# Patient Record
Sex: Female | Born: 1955 | Race: White | Hispanic: No | Marital: Married | Smoking: Never smoker
Health system: Southern US, Community
[De-identification: ages and names within clinical notes are randomized; demographics above are authoritative.]

## PROBLEM LIST (undated history)

## (undated) DIAGNOSIS — E785 Hyperlipidemia, unspecified: Secondary | ICD-10-CM

## (undated) DIAGNOSIS — T8859XA Other complications of anesthesia, initial encounter: Secondary | ICD-10-CM

## (undated) DIAGNOSIS — E079 Disorder of thyroid, unspecified: Secondary | ICD-10-CM

## (undated) DIAGNOSIS — K222 Esophageal obstruction: Secondary | ICD-10-CM

## (undated) DIAGNOSIS — T4145XA Adverse effect of unspecified anesthetic, initial encounter: Secondary | ICD-10-CM

## (undated) DIAGNOSIS — G4700 Insomnia, unspecified: Secondary | ICD-10-CM

## (undated) DIAGNOSIS — IMO0001 Reserved for inherently not codable concepts without codable children: Secondary | ICD-10-CM

## (undated) DIAGNOSIS — M810 Age-related osteoporosis without current pathological fracture: Secondary | ICD-10-CM

## (undated) DIAGNOSIS — Z973 Presence of spectacles and contact lenses: Secondary | ICD-10-CM

## (undated) HISTORY — DX: Disorder of thyroid, unspecified: E07.9

## (undated) HISTORY — DX: Reserved for inherently not codable concepts without codable children: IMO0001

## (undated) HISTORY — PX: COLONOSCOPY: SHX174

---
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
## 1983-12-24 DIAGNOSIS — E0500 Thyrotoxicosis with diffuse goiter without thyrotoxic crisis or storm: Secondary | ICD-10-CM

## 1983-12-24 HISTORY — DX: Thyrotoxicosis with diffuse goiter without thyrotoxic crisis or storm: E05.00

## 2000-07-29 ENCOUNTER — Encounter: Payer: Self-pay | Admitting: Obstetrics and Gynecology

## 2000-07-29 ENCOUNTER — Encounter: Admission: RE | Admit: 2000-07-29 | Discharge: 2000-07-29 | Payer: Self-pay | Admitting: Obstetrics and Gynecology

## 2000-07-31 ENCOUNTER — Encounter: Admission: RE | Admit: 2000-07-31 | Discharge: 2000-07-31 | Payer: Self-pay | Admitting: Obstetrics and Gynecology

## 2000-07-31 ENCOUNTER — Encounter: Payer: Self-pay | Admitting: Obstetrics and Gynecology

## 2001-08-04 ENCOUNTER — Encounter: Admission: RE | Admit: 2001-08-04 | Discharge: 2001-08-04 | Payer: Self-pay | Admitting: Obstetrics and Gynecology

## 2001-08-04 ENCOUNTER — Encounter: Payer: Self-pay | Admitting: Obstetrics and Gynecology

## 2002-08-06 ENCOUNTER — Encounter: Payer: Self-pay | Admitting: Obstetrics and Gynecology

## 2002-08-06 ENCOUNTER — Encounter: Admission: RE | Admit: 2002-08-06 | Discharge: 2002-08-06 | Payer: Self-pay | Admitting: Obstetrics and Gynecology

## 2003-08-09 ENCOUNTER — Encounter: Admission: RE | Admit: 2003-08-09 | Discharge: 2003-08-09 | Payer: Self-pay | Admitting: Obstetrics and Gynecology

## 2003-08-09 ENCOUNTER — Encounter: Payer: Self-pay | Admitting: Obstetrics and Gynecology

## 2004-08-14 ENCOUNTER — Encounter: Admission: RE | Admit: 2004-08-14 | Discharge: 2004-08-14 | Payer: Self-pay | Admitting: Obstetrics and Gynecology

## 2005-08-15 ENCOUNTER — Encounter: Admission: RE | Admit: 2005-08-15 | Discharge: 2005-08-15 | Payer: Self-pay | Admitting: Obstetrics and Gynecology

## 2005-08-16 ENCOUNTER — Encounter: Admission: RE | Admit: 2005-08-16 | Discharge: 2005-08-16 | Payer: Self-pay | Admitting: Obstetrics and Gynecology

## 2006-01-28 ENCOUNTER — Encounter: Admission: RE | Admit: 2006-01-28 | Discharge: 2006-01-28 | Payer: Self-pay | Admitting: Obstetrics and Gynecology

## 2006-08-21 ENCOUNTER — Encounter: Admission: RE | Admit: 2006-08-21 | Discharge: 2006-08-21 | Payer: Self-pay | Admitting: Obstetrics and Gynecology

## 2007-09-01 ENCOUNTER — Encounter: Admission: RE | Admit: 2007-09-01 | Discharge: 2007-09-01 | Payer: Self-pay | Admitting: Obstetrics and Gynecology

## 2008-09-01 ENCOUNTER — Encounter: Admission: RE | Admit: 2008-09-01 | Discharge: 2008-09-01 | Payer: Self-pay | Admitting: Obstetrics and Gynecology

## 2008-09-06 ENCOUNTER — Encounter: Admission: RE | Admit: 2008-09-06 | Discharge: 2008-09-06 | Payer: Self-pay | Admitting: Obstetrics and Gynecology

## 2009-09-08 ENCOUNTER — Encounter: Admission: RE | Admit: 2009-09-08 | Discharge: 2009-09-08 | Payer: Self-pay | Admitting: Obstetrics and Gynecology

## 2010-09-11 ENCOUNTER — Encounter: Admission: RE | Admit: 2010-09-11 | Discharge: 2010-09-11 | Payer: Self-pay | Admitting: Internal Medicine

## 2011-04-19 ENCOUNTER — Ambulatory Visit (AMBULATORY_SURGERY_CENTER): Payer: BC Managed Care – PPO | Admitting: *Deleted

## 2011-04-19 ENCOUNTER — Encounter: Payer: Self-pay | Admitting: Internal Medicine

## 2011-04-19 VITALS — Ht 62.0 in | Wt <= 1120 oz

## 2011-04-19 DIAGNOSIS — Z1211 Encounter for screening for malignant neoplasm of colon: Secondary | ICD-10-CM

## 2011-04-19 MED ORDER — PEG-KCL-NACL-NASULF-NA ASC-C 100 G PO SOLR: ORAL | Status: DC

## 2011-05-03 ENCOUNTER — Ambulatory Visit (AMBULATORY_SURGERY_CENTER): Payer: BC Managed Care – PPO | Admitting: Internal Medicine

## 2011-05-03 ENCOUNTER — Encounter: Payer: Self-pay | Admitting: Internal Medicine

## 2011-05-03 VITALS — BP 139/87 | HR 91 | Temp 98.2°F | Resp 14 | Ht 62.0 in | Wt 107.0 lb

## 2011-05-03 DIAGNOSIS — Z1211 Encounter for screening for malignant neoplasm of colon: Secondary | ICD-10-CM

## 2011-05-03 MED ORDER — SODIUM CHLORIDE 0.9 % IV SOLN: 500.0000 mL | INTRAVENOUS | Status: DC

## 2011-05-06 ENCOUNTER — Telehealth: Payer: Self-pay | Admitting: *Deleted

## 2011-05-06 NOTE — Telephone Encounter (Signed)

## 2011-08-09 ENCOUNTER — Other Ambulatory Visit: Payer: Self-pay | Admitting: Internal Medicine

## 2011-08-09 DIAGNOSIS — Z1231 Encounter for screening mammogram for malignant neoplasm of breast: Secondary | ICD-10-CM

## 2011-09-13 ENCOUNTER — Ambulatory Visit
Admission: RE | Admit: 2011-09-13 | Discharge: 2011-09-13 | Disposition: A | Discharge status: Home / Self Care | Payer: BC Managed Care – PPO | Source: Ambulatory Visit | Attending: Internal Medicine | Admitting: Internal Medicine

## 2011-09-13 DIAGNOSIS — Z1231 Encounter for screening mammogram for malignant neoplasm of breast: Secondary | ICD-10-CM

## 2011-09-18 ENCOUNTER — Encounter (HOSPITAL_COMMUNITY): Payer: Self-pay

## 2011-09-18 ENCOUNTER — Emergency Department (HOSPITAL_COMMUNITY): Payer: BC Managed Care – PPO

## 2011-09-18 ENCOUNTER — Emergency Department (HOSPITAL_COMMUNITY)
Admission: EM | Admit: 2011-09-18 | Discharge: 2011-09-18 | Disposition: A | Discharge status: Home / Self Care | Payer: BC Managed Care – PPO | Attending: Emergency Medicine | Admitting: Emergency Medicine

## 2011-09-18 DIAGNOSIS — R51 Headache: Secondary | ICD-10-CM | POA: Insufficient documentation

## 2011-09-18 DIAGNOSIS — R209 Unspecified disturbances of skin sensation: Secondary | ICD-10-CM | POA: Insufficient documentation

## 2011-09-18 DIAGNOSIS — M62838 Other muscle spasm: Secondary | ICD-10-CM | POA: Insufficient documentation

## 2011-09-18 DIAGNOSIS — R079 Chest pain, unspecified: Secondary | ICD-10-CM | POA: Insufficient documentation

## 2011-09-18 LAB — URINALYSIS, ROUTINE W REFLEX MICROSCOPIC
Glucose, UA: NEGATIVE mg/dL
Leukocytes, UA: NEGATIVE
pH: 7 (ref 5.0–8.0)

## 2011-09-18 LAB — CBC
HCT: 37.4 % (ref 36.0–46.0)
Hemoglobin: 12.8 g/dL (ref 12.0–15.0)
MCHC: 34.2 g/dL (ref 30.0–36.0)
MCV: 94.4 fL (ref 78.0–100.0)

## 2011-09-18 LAB — POCT I-STAT, CHEM 8
Creatinine, Ser: 0.5 mg/dL (ref 0.50–1.10)
Glucose, Bld: 106 mg/dL — ABNORMAL HIGH (ref 70–99)
Hemoglobin: 13.6 g/dL (ref 12.0–15.0)
Potassium: 4.1 mEq/L (ref 3.5–5.1)

## 2011-09-18 LAB — DIFFERENTIAL
Basophils Absolute: 0.1 10*3/uL (ref 0.0–0.1)
Basophils Relative: 1 % (ref 0–1)
Eosinophils Absolute: 0 10*3/uL (ref 0.0–0.7)
Lymphocytes Relative: 21 % (ref 12–46)
Lymphs Abs: 1.3 10*3/uL (ref 0.7–4.0)
Monocytes Absolute: 0.9 10*3/uL (ref 0.1–1.0)

## 2011-10-21 ENCOUNTER — Encounter: Payer: Self-pay | Admitting: Internal Medicine

## 2011-10-21 ENCOUNTER — Ambulatory Visit (INDEPENDENT_AMBULATORY_CARE_PROVIDER_SITE_OTHER): Payer: BC Managed Care – PPO | Admitting: Internal Medicine

## 2011-10-21 VITALS — BP 142/90 | HR 96 | Temp 98.3°F | Ht 61.5 in | Wt 113.0 lb

## 2011-10-21 DIAGNOSIS — Z23 Encounter for immunization: Secondary | ICD-10-CM

## 2011-10-21 DIAGNOSIS — E079 Disorder of thyroid, unspecified: Secondary | ICD-10-CM

## 2011-10-21 NOTE — Progress Notes (Signed)
  Subjective:    Patient ID: Crystal [name], female    DOB: Nov 30, 1956, 55 y.o.   MRN: [id number]  HPI  To establish  Past Medical History  Diagnosis Date  . Thyroid disease 1985    grave's dzs---treated with PTU   History reviewed. No pertinent past surgical history.  reports that she has never smoked. She does not have any smokeless tobacco history on file. She reports that she drinks about 8.4 ounces of alcohol per week. She reports that she does not use illicit drugs. family history includes Dementia in her mother; Heart attack in her father; Heart disease in her father; and Heart disease (age of onset:80) in her mother. Allergies  Allergen Reactions  . E-Mycin (Erythromycin Base) Swelling  . Other Swelling    Adhesive tape, bandaids    Review of Systems  patient denies chest pain, shortness of breath, orthopnea. Denies lower extremity edema, abdominal pain, change in appetite, change in bowel movements. Patient denies rashes, musculoskeletal complaints. No other specific complaints in a complete review of systems.      Objective:   Physical Exam  Well-developed well-nourished female in no acute distress. HEENT exam atraumatic, normocephalic, extraocular muscles are intact. Neck is supple. No jugular venous distention no thyromegaly. Chest clear to auscultation without increased work of breathing. Cardiac exam S1 and S2 are regular. Abdominal exam active bowel sounds, soft, nontender. Extremities no edema. Neurologic exam she is alert without any motor sensory deficits. Gait is normal.        Assessment & Plan:

## 2011-10-21 NOTE — Patient Instructions (Signed)
Medical release for previous records  GYN---[facility] (Tim Fontaine, [place])

## 2011-10-30 NOTE — Assessment & Plan Note (Signed)
History of Graves' disease. Needs periodic followup. She is currently asymptomatic. Graves' disease was many years ago.

## 2012-04-13 ENCOUNTER — Other Ambulatory Visit (INDEPENDENT_AMBULATORY_CARE_PROVIDER_SITE_OTHER): Payer: BC Managed Care – PPO

## 2012-04-13 DIAGNOSIS — Z0000 Encounter for general adult medical examination without abnormal findings: Secondary | ICD-10-CM

## 2012-04-13 LAB — POCT URINALYSIS DIPSTICK
Protein, UA: NEGATIVE
Spec Grav, UA: 1.01
Urobilinogen, UA: 0.2
pH, UA: 6.5

## 2012-04-13 LAB — LIPID PANEL
Cholesterol: 230 mg/dL — ABNORMAL HIGH (ref 0–200)
Total CHOL/HDL Ratio: 3

## 2012-04-13 LAB — HEPATIC FUNCTION PANEL
Albumin: 4.3 g/dL (ref 3.5–5.2)
Alkaline Phosphatase: 45 U/L (ref 39–117)
Total Protein: 7.3 g/dL (ref 6.0–8.3)

## 2012-04-13 LAB — CBC WITH DIFFERENTIAL/PLATELET
Basophils Relative: 0.7 % (ref 0.0–3.0)
Eosinophils Absolute: 0.4 10*3/uL (ref 0.0–0.7)
Hemoglobin: 13.2 g/dL (ref 12.0–15.0)
MCHC: 33.2 g/dL (ref 30.0–36.0)
MCV: 97.2 fl (ref 78.0–100.0)
Monocytes Absolute: 0.8 10*3/uL (ref 0.1–1.0)
Neutro Abs: 2.1 10*3/uL (ref 1.4–7.7)
RBC: 4.1 Mil/uL (ref 3.87–5.11)

## 2012-04-13 LAB — BASIC METABOLIC PANEL
CO2: 27 mEq/L (ref 19–32)
Chloride: 103 mEq/L (ref 96–112)
Sodium: 140 mEq/L (ref 135–145)

## 2012-04-20 ENCOUNTER — Encounter: Payer: Self-pay | Admitting: Internal Medicine

## 2012-04-20 ENCOUNTER — Ambulatory Visit (INDEPENDENT_AMBULATORY_CARE_PROVIDER_SITE_OTHER): Payer: BC Managed Care – PPO | Admitting: Internal Medicine

## 2012-04-20 ENCOUNTER — Encounter: Payer: BC Managed Care – PPO | Admitting: Internal Medicine

## 2012-04-20 VITALS — BP 150/90 | HR 98 | Temp 98.2°F | Ht 62.0 in | Wt 110.0 lb

## 2012-04-20 DIAGNOSIS — Z0000 Encounter for general adult medical examination without abnormal findings: Secondary | ICD-10-CM

## 2012-04-20 DIAGNOSIS — Z23 Encounter for immunization: Secondary | ICD-10-CM

## 2012-04-20 MED ORDER — AMITRIPTYLINE HCL 25 MG PO TABS: 25.0000 mg | ORAL_TABLET | Freq: Every day | ORAL | Status: DC

## 2012-04-20 NOTE — Progress Notes (Signed)
Patient ID: Crystal [name], female   DOB: [birth year], 56 y.o.   MRN: [id number]  cpx  Past Medical History  Diagnosis Date  . Thyroid disease 1985    grave's dzs---treated with PTU    History   Social History  . Marital Status: Married    Spouse Name: N/A    Number of Children: N/A  . Years of Education: N/A   Occupational History  . Not on file.   Social History Main Topics  . Smoking status: Never Smoker   . Smokeless tobacco: Not on file  . Alcohol Use: 8.4 oz/week    14 Glasses of wine per week  . Drug Use: No  . Sexually Active: Not on file   Other Topics Concern  . Not on file   Social History Narrative  . No narrative on file    No past surgical history on file.  Family History  Problem Relation Age of Onset  . Dementia Mother   . Heart disease Mother 24    sudden cardiac---?? heart block  . Heart disease Father     CABG  . Heart attack Father     Allergies  Allergen Reactions  . E-Mycin (Erythromycin Base) Swelling  . Other Swelling    Adhesive tape, bandaids    Current Outpatient Prescriptions on File Prior to Visit  Medication Sig Dispense Refill  . amitriptyline (ELAVIL) 25 MG tablet Take 25 mg by mouth at bedtime.        . Calcium-Vitamin D-Vitamin K (CALCIUM SOFT CHEWS PO) Take 2 each by mouth daily.           patient denies chest pain, shortness of breath, orthopnea. Denies lower extremity edema, abdominal pain, change in appetite, change in bowel movements. Patient denies rashes, musculoskeletal complaints. No other specific complaints in a complete review of systems.   BP 150/90  Pulse 98  Temp(Src) 98.2 F (36.8 C) (Oral)  Ht 5\' 2"  (1.575 m)  Wt 110 lb (49.896 kg)  BMI 20.12 kg/m2  Well-developed well-nourished female in no acute distress. HEENT exam atraumatic, normocephalic, extraocular muscles are intact. Neck is supple. No jugular venous distention no thyromegaly. Chest clear to auscultation without increased work of  breathing. Cardiac exam S1 and S2 are regular. Abdominal exam active bowel sounds, soft, nontender. Extremities no edema. Neurologic exam she is alert without any motor sensory deficits. Gait is normal. exernal genitalia normal, cervix normal, PAP smear done   Well Visit: health maint UTD

## 2012-05-14 ENCOUNTER — Other Ambulatory Visit: Payer: Self-pay | Admitting: Internal Medicine

## 2012-08-05 ENCOUNTER — Other Ambulatory Visit: Payer: Self-pay | Admitting: Internal Medicine

## 2012-08-05 DIAGNOSIS — Z1231 Encounter for screening mammogram for malignant neoplasm of breast: Secondary | ICD-10-CM

## 2012-09-15 ENCOUNTER — Ambulatory Visit
Admission: RE | Admit: 2012-09-15 | Discharge: 2012-09-15 | Disposition: A | Discharge status: Home / Self Care | Payer: BC Managed Care – PPO | Source: Ambulatory Visit | Attending: Internal Medicine | Admitting: Internal Medicine

## 2012-09-15 DIAGNOSIS — Z1231 Encounter for screening mammogram for malignant neoplasm of breast: Secondary | ICD-10-CM

## 2012-10-26 ENCOUNTER — Ambulatory Visit (INDEPENDENT_AMBULATORY_CARE_PROVIDER_SITE_OTHER): Payer: BC Managed Care – PPO | Admitting: Internal Medicine

## 2012-10-26 DIAGNOSIS — Z23 Encounter for immunization: Secondary | ICD-10-CM

## 2013-01-26 ENCOUNTER — Other Ambulatory Visit: Payer: Self-pay | Admitting: Internal Medicine

## 2013-01-30 ENCOUNTER — Other Ambulatory Visit: Payer: Self-pay | Admitting: Internal Medicine

## 2013-04-20 ENCOUNTER — Other Ambulatory Visit (INDEPENDENT_AMBULATORY_CARE_PROVIDER_SITE_OTHER): Payer: BC Managed Care – PPO

## 2013-04-20 DIAGNOSIS — Z0000 Encounter for general adult medical examination without abnormal findings: Secondary | ICD-10-CM

## 2013-04-20 LAB — CBC WITH DIFFERENTIAL/PLATELET
Basophils Relative: 0 % (ref 0.0–3.0)
Eosinophils Relative: 6.8 % — ABNORMAL HIGH (ref 0.0–5.0)
Hemoglobin: 13.6 g/dL (ref 12.0–15.0)
Lymphocytes Relative: 35.3 % (ref 12.0–46.0)
MCHC: 35 g/dL (ref 30.0–36.0)
Monocytes Relative: 11.6 % (ref 3.0–12.0)
Neutro Abs: 2.3 10*3/uL (ref 1.4–7.7)
RBC: 4.05 Mil/uL (ref 3.87–5.11)
WBC: 5 10*3/uL (ref 4.5–10.5)

## 2013-04-20 LAB — HEPATIC FUNCTION PANEL
ALT: 21 U/L (ref 0–35)
Albumin: 4.2 g/dL (ref 3.5–5.2)
Total Bilirubin: 0.7 mg/dL (ref 0.3–1.2)

## 2013-04-20 LAB — POCT URINALYSIS DIPSTICK
Glucose, UA: NEGATIVE
Nitrite, UA: NEGATIVE
Urobilinogen, UA: 0.2

## 2013-04-20 LAB — LIPID PANEL
LDL Cholesterol: 106 mg/dL — ABNORMAL HIGH (ref 0–99)
Total CHOL/HDL Ratio: 3
VLDL: 12.6 mg/dL (ref 0.0–40.0)

## 2013-04-20 LAB — BASIC METABOLIC PANEL
BUN: 10 mg/dL (ref 6–23)
CO2: 30 mEq/L (ref 19–32)
Chloride: 103 mEq/L (ref 96–112)
Creatinine, Ser: 0.8 mg/dL (ref 0.4–1.2)
Glucose, Bld: 96 mg/dL (ref 70–99)

## 2013-04-20 LAB — TSH: TSH: 2.47 u[IU]/mL (ref 0.35–5.50)

## 2013-05-06 ENCOUNTER — Other Ambulatory Visit: Payer: BC Managed Care – PPO

## 2013-05-10 ENCOUNTER — Encounter: Payer: Self-pay | Admitting: Internal Medicine

## 2013-05-11 ENCOUNTER — Encounter: Payer: Self-pay | Admitting: Internal Medicine

## 2013-05-11 ENCOUNTER — Ambulatory Visit (INDEPENDENT_AMBULATORY_CARE_PROVIDER_SITE_OTHER): Payer: BC Managed Care – PPO | Admitting: Internal Medicine

## 2013-05-11 VITALS — BP 150/90 | HR 96 | Temp 97.9°F | Ht 61.5 in | Wt 102.0 lb

## 2013-05-11 DIAGNOSIS — Z0000 Encounter for general adult medical examination without abnormal findings: Secondary | ICD-10-CM

## 2013-05-11 NOTE — Progress Notes (Signed)
cpx  Past Medical History  Diagnosis Date  . Thyroid disease 1985    grave's dzs---treated with PTU    History   Social History  . Marital Status: Married    Spouse Name: N/A    Number of Children: N/A  . Years of Education: N/A   Occupational History  . Not on file.   Social History Main Topics  . Smoking status: Never Smoker   . Smokeless tobacco: Not on file  . Alcohol Use: 8.4 oz/week    14 Glasses of wine per week  . Drug Use: No  . Sexually Active: Not on file   Other Topics Concern  . Not on file   Social History Narrative  . No narrative on file    No past surgical history on file.  Family History  Problem Relation Age of Onset  . Dementia Mother   . Heart disease Mother 94    sudden cardiac---?? heart block  . Heart disease Father     CABG  . Heart attack Father     Allergies  Allergen Reactions  . E-Mycin (Erythromycin Base) Swelling  . Other Swelling    Adhesive tape, bandaids    Current Outpatient Prescriptions on File Prior to Visit  Medication Sig Dispense Refill  . amitriptyline (ELAVIL) 25 MG tablet TAKE 1 OR 2 TABLETS BY MOUTH AT BEDTIME  180 tablet  0  . Calcium-Vitamin D-Vitamin K (CALCIUM SOFT CHEWS PO) Take 2 each by mouth daily.         No current facility-administered medications on file prior to visit.     patient denies chest pain, shortness of breath, orthopnea. Denies lower extremity edema, abdominal pain, change in appetite, change in bowel movements. Patient denies rashes, musculoskeletal complaints. No other specific complaints in a complete review of systems.   There were no vitals taken for this visit.  Well-developed well-nourished female in no acute distress. HEENT exam atraumatic, normocephalic, extraocular muscles are intact. Neck is supple. No jugular venous distention no thyromegaly. Chest clear to auscultation without increased work of breathing. Cardiac exam S1 and S2 are regular. Abdominal exam active bowel  sounds, soft, nontender. Extremities no edema. Neurologic exam she is alert without any motor sensory deficits. Gait is normal.  A/P- well visit- health maint utd

## 2013-08-19 ENCOUNTER — Other Ambulatory Visit: Payer: Self-pay | Admitting: Internal Medicine

## 2013-08-19 ENCOUNTER — Other Ambulatory Visit: Payer: Self-pay

## 2013-08-19 DIAGNOSIS — Z0000 Encounter for general adult medical examination without abnormal findings: Secondary | ICD-10-CM

## 2013-08-19 DIAGNOSIS — Z1231 Encounter for screening mammogram for malignant neoplasm of breast: Secondary | ICD-10-CM

## 2013-09-02 ENCOUNTER — Other Ambulatory Visit: Payer: Self-pay | Admitting: Dermatology

## 2013-09-16 ENCOUNTER — Ambulatory Visit: Payer: BC Managed Care – PPO

## 2013-09-18 ENCOUNTER — Other Ambulatory Visit: Payer: Self-pay | Admitting: Internal Medicine

## 2013-09-22 ENCOUNTER — Ambulatory Visit
Admission: RE | Admit: 2013-09-22 | Discharge: 2013-09-22 | Disposition: A | Discharge status: Home / Self Care | Payer: BC Managed Care – PPO | Source: Ambulatory Visit

## 2013-09-22 ENCOUNTER — Ambulatory Visit (HOSPITAL_BASED_OUTPATIENT_CLINIC_OR_DEPARTMENT_OTHER): Payer: BC Managed Care – PPO

## 2013-09-22 DIAGNOSIS — Z1231 Encounter for screening mammogram for malignant neoplasm of breast: Secondary | ICD-10-CM

## 2013-09-29 ENCOUNTER — Ambulatory Visit (INDEPENDENT_AMBULATORY_CARE_PROVIDER_SITE_OTHER): Payer: BC Managed Care – PPO

## 2013-09-29 DIAGNOSIS — Z23 Encounter for immunization: Secondary | ICD-10-CM

## 2014-07-04 ENCOUNTER — Other Ambulatory Visit: Payer: BC Managed Care – PPO

## 2014-07-04 ENCOUNTER — Encounter: Payer: BC Managed Care – PPO | Admitting: Internal Medicine

## 2014-07-05 ENCOUNTER — Other Ambulatory Visit (INDEPENDENT_AMBULATORY_CARE_PROVIDER_SITE_OTHER): Payer: 59

## 2014-07-05 DIAGNOSIS — Z0000 Encounter for general adult medical examination without abnormal findings: Secondary | ICD-10-CM

## 2014-07-05 LAB — POCT URINALYSIS DIPSTICK
BILIRUBIN UA: NEGATIVE
Blood, UA: NEGATIVE
GLUCOSE UA: NEGATIVE
KETONES UA: NEGATIVE
Leukocytes, UA: NEGATIVE
Nitrite, UA: NEGATIVE
SPEC GRAV UA: 1.02
Urobilinogen, UA: 0.2
pH, UA: 6.5

## 2014-07-05 LAB — HEPATIC FUNCTION PANEL
ALK PHOS: 46 U/L (ref 39–117)
ALT: 20 U/L (ref 0–35)
AST: 34 U/L (ref 0–37)
Albumin: 4.2 g/dL (ref 3.5–5.2)
BILIRUBIN TOTAL: 0.7 mg/dL (ref 0.2–1.2)
Bilirubin, Direct: 0.1 mg/dL (ref 0.0–0.3)
Total Protein: 7.1 g/dL (ref 6.0–8.3)

## 2014-07-05 LAB — CBC WITH DIFFERENTIAL/PLATELET
Basophils Absolute: 0.1 10*3/uL (ref 0.0–0.1)
Basophils Relative: 1.2 % (ref 0.0–3.0)
EOS ABS: 0.2 10*3/uL (ref 0.0–0.7)
EOS PCT: 4 % (ref 0.0–5.0)
HCT: 41.1 % (ref 36.0–46.0)
Hemoglobin: 13.4 g/dL (ref 12.0–15.0)
Lymphocytes Relative: 44 % (ref 12.0–46.0)
Lymphs Abs: 2.3 10*3/uL (ref 0.7–4.0)
MCHC: 32.7 g/dL (ref 30.0–36.0)
MCV: 99.3 fl (ref 78.0–100.0)
MONO ABS: 0.7 10*3/uL (ref 0.1–1.0)
Monocytes Relative: 12.7 % — ABNORMAL HIGH (ref 3.0–12.0)
NEUTROS PCT: 38.1 % — AB (ref 43.0–77.0)
Neutro Abs: 2 10*3/uL (ref 1.4–7.7)
PLATELETS: 261 10*3/uL (ref 150.0–400.0)
RBC: 4.14 Mil/uL (ref 3.87–5.11)
RDW: 14.1 % (ref 11.5–15.5)
WBC: 5.3 10*3/uL (ref 4.0–10.5)

## 2014-07-05 LAB — LIPID PANEL
CHOLESTEROL: 226 mg/dL — AB (ref 0–200)
HDL: 85.2 mg/dL (ref >39.00)
LDL Cholesterol: 130 mg/dL — ABNORMAL HIGH (ref 0–99)
NonHDL: 140.8
TRIGLYCERIDES: 55 mg/dL (ref 0.0–149.0)
Total CHOL/HDL Ratio: 3
VLDL: 11 mg/dL (ref 0.0–40.0)

## 2014-07-05 LAB — TSH: TSH: 4.26 u[IU]/mL (ref 0.35–4.50)

## 2014-07-05 LAB — BASIC METABOLIC PANEL
BUN: 11 mg/dL (ref 6–23)
CHLORIDE: 102 meq/L (ref 96–112)
CO2: 30 mEq/L (ref 19–32)
CREATININE: 0.6 mg/dL (ref 0.4–1.2)
Calcium: 9.1 mg/dL (ref 8.4–10.5)
GFR: 101.26 mL/min (ref >60.00)
Glucose, Bld: 96 mg/dL (ref 70–99)
Potassium: 3.7 mEq/L (ref 3.5–5.1)
Sodium: 139 mEq/L (ref 135–145)

## 2014-07-11 ENCOUNTER — Encounter: Payer: BC Managed Care – PPO | Admitting: Internal Medicine

## 2014-07-12 ENCOUNTER — Encounter: Payer: Self-pay | Admitting: Internal Medicine

## 2014-07-12 ENCOUNTER — Ambulatory Visit (INDEPENDENT_AMBULATORY_CARE_PROVIDER_SITE_OTHER): Payer: 59 | Admitting: Internal Medicine

## 2014-07-12 VITALS — BP 140/89 | HR 88 | Temp 98.5°F | Ht 62.0 in | Wt 104.0 lb

## 2014-07-12 DIAGNOSIS — Z780 Asymptomatic menopausal state: Secondary | ICD-10-CM

## 2014-07-12 NOTE — Progress Notes (Signed)
Pre visit review using our clinic review tool, if applicable. No additional management support is needed unless otherwise documented below in the visit note. 

## 2014-07-12 NOTE — Patient Instructions (Addendum)
Schedule DEXA scan

## 2014-07-12 NOTE — Progress Notes (Signed)
cpx  Past Medical History  Diagnosis Date  . Thyroid disease 1985    grave's dzs---treated with PTU    History   Social History  . Marital Status: Married    Spouse Name: N/A    Number of Children: N/A  . Years of Education: N/A   Occupational History  . Not on file.   Social History Main Topics  . Smoking status: Never Smoker   . Smokeless tobacco: Not on file  . Alcohol Use: 8.4 oz/week    14 Glasses of wine per week  . Drug Use: No  . Sexual Activity: Not on file   Other Topics Concern  . Not on file   Social History Narrative  . No narrative on file    No past surgical history on file.  Family History  Problem Relation Age of Onset  . Dementia Mother   . Heart disease Mother 88    sudden cardiac---?? heart block  . Heart disease Father     CABG  . Heart attack Father     Allergies  Allergen Reactions  . E-Mycin [Erythromycin Base] Swelling  . Other Swelling    Adhesive tape, bandaids    Current Outpatient Prescriptions on File Prior to Visit  Medication Sig Dispense Refill  . amitriptyline (ELAVIL) 25 MG tablet TAKE 1 TO 2 TABLETS BY MOUTH AT BEDTIME  180 tablet  2  . Calcium-Vitamin D-Vitamin K (CALCIUM SOFT CHEWS PO) Take 2 each by mouth daily.         No current facility-administered medications on file prior to visit.     patient denies chest pain, shortness of breath, orthopnea. Denies lower extremity edema, abdominal pain, change in appetite, change in bowel movements. Patient denies rashes, musculoskeletal complaints. No other specific complaints in a complete review of systems.   BP 140/89  Pulse 88  Temp(Src) 98.5 F (36.9 C) (Oral)  Ht 5\' 2"  (1.575 m)  Wt 104 lb (47.174 kg)  BMI 19.02 kg/m2  Well-developed well-nourished female in no acute distress. HEENT exam atraumatic, normocephalic, extraocular muscles are intact. Neck is supple. No jugular venous distention no thyromegaly. Chest clear to auscultation without increased work of  breathing. Cardiac exam S1 and S2 are regular. Abdominal exam active bowel sounds, soft, nontender. Extremities no edema. Neurologic exam she is alert without any motor sensory deficits. Gait is normal.  Well visit- health maint UTD

## 2014-07-13 ENCOUNTER — Ambulatory Visit (INDEPENDENT_AMBULATORY_CARE_PROVIDER_SITE_OTHER)
Admission: RE | Admit: 2014-07-13 | Discharge: 2014-07-13 | Disposition: A | Discharge status: Home / Self Care | Payer: 59 | Source: Ambulatory Visit | Attending: Internal Medicine | Admitting: Internal Medicine

## 2014-07-13 DIAGNOSIS — Z780 Asymptomatic menopausal state: Secondary | ICD-10-CM

## 2014-07-21 ENCOUNTER — Telehealth: Payer: Self-pay | Admitting: Internal Medicine

## 2014-07-21 NOTE — Telephone Encounter (Signed)
Pt would like bone density test results

## 2014-07-22 NOTE — Telephone Encounter (Signed)
Pt following up on bone density results.

## 2014-07-25 ENCOUNTER — Encounter: Payer: Self-pay | Admitting: Internal Medicine

## 2014-07-26 ENCOUNTER — Encounter: Payer: Self-pay | Admitting: Family Medicine

## 2014-07-26 ENCOUNTER — Ambulatory Visit (INDEPENDENT_AMBULATORY_CARE_PROVIDER_SITE_OTHER): Payer: 59 | Admitting: Family Medicine

## 2014-07-26 VITALS — BP 160/94 | HR 88 | Temp 97.8°F | Wt 102.0 lb

## 2014-07-26 DIAGNOSIS — M810 Age-related osteoporosis without current pathological fracture: Secondary | ICD-10-CM

## 2014-07-26 DIAGNOSIS — IMO0001 Reserved for inherently not codable concepts without codable children: Secondary | ICD-10-CM

## 2014-07-26 DIAGNOSIS — R030 Elevated blood-pressure reading, without diagnosis of hypertension: Secondary | ICD-10-CM | POA: Insufficient documentation

## 2014-07-26 NOTE — Assessment & Plan Note (Addendum)
1. Advised 1200mg  calcium and 800 IU vitamin D. Discussed risks of these medications as well specifically calcium.  2. Recommended minimum 30 minutes 3x a week exercise (patient also asked for potential PT or trainer to supervise program-told her I would ask Dr. Charlann Boxer of [place] for any recommendations).  3. Advised to reduce wine intake to 1 glass per day.  4. Medication-given only unilateral score of -2.6 and otherwise with osteopenia, we discussed medication therapy. Patient would prefer to stay off medication if possible. We ultimately agreed to above measures as well as repeat bone density in 1 year, if progression would start on alendronate next year.   Addendum 07/28/14. Will refer to Dr. Charlann Boxer of [facility] for discussion of exercise plan.

## 2014-07-26 NOTE — Assessment & Plan Note (Signed)
May be element of white coat HTN. Asked patient to bring in home cuff so we can compare. If cuff's do not correlate, will need to start on agent and do some basic labs for HTN.

## 2014-07-26 NOTE — Patient Instructions (Addendum)
Osteoporosis  Exercise at least 30 minutes 3x a week  I will contact Dr. Tamala Julian to see if he has any recommendations for specific trainers or PT for osteoporosis  I would recommend 1200mg  calcium and 800 IU vitamin D. Foods rich in calcium such as dairy would be a decent choice as well.   We will recheck your bone mineral density in 1 year.   Blood pressure goal <140/90  When we see each other back bring your cuff so we can verify  Dash diet (helps with blood pressure) or mediterranean diet (cardiovascular disease)  Follow up in 6 months

## 2014-07-26 NOTE — Progress Notes (Signed)
  Garret Reddish, MD Phone: [phone]  Subjective:   Crystal [name] is a 58 y.o. year old very pleasant female patient who presents with the following:  Osteoporosis Recently discovered on bone density with unilateral femur score of -2.6. No history of fractures in adult life. History hyperthyroidism. Nonsmoker. Does drink 2 glasses of wine per day.  ROS- no weakness of fatigue.   Hypertension BP Readings from Last 3 Encounters:  07/26/14 160/94  07/12/14 140/89  05/11/13 150/90  home BP monitoring-usually 130s over 80s at home when checks Compliant with medications-no medications.  ROS-Denies any CP, HA, SOB, blurry vision, LE edema  Past Medical History- Patient Active Problem List   Diagnosis Date Noted  . Elevated blood pressure 07/26/2014  . Osteoporosis 07/26/2014  . Thyroid disease    Medications- reviewed and updated Current Outpatient Prescriptions  Medication Sig Dispense Refill  . amitriptyline (ELAVIL) 25 MG tablet TAKE 1 TO 2 TABLETS BY MOUTH AT BEDTIME  180 tablet  2  . Calcium-Vitamin D-Vitamin K (CALCIUM SOFT CHEWS PO) Take 2 each by mouth daily.         No current facility-administered medications for this visit.   Objective: BP 160/94  Pulse 88  Temp(Src) 97.8 F (36.6 C)  Wt 102 lb (46.267 kg) Gen: NAD, resting comfortably in chair CV: RRR no murmurs rubs or gallops Lungs: CTAB no crackles, wheeze, rhonchi Ext: no edema   Assessment/Plan:  Elevated blood pressure May be element of white coat HTN. Asked patient to bring in home cuff so we can compare. If cuff's do not correlate, will need to start on agent and do some basic labs for HTN.   Osteoporosis 1. Advised 1200mg  calcium and 800 IU vitamin D. Discussed risks of these medications as well specifically calcium.  2. Recommended minimum 30 minutes 3x a week exercise (patient also asked for potential PT or trainer to supervise program-told her I would ask Dr. Charlann Boxer of [place] for any  recommendations).  3. Advised to reduce wine intake to 1 glass per day.  4. Medication-given only unilateral score of -2.6 and otherwise with osteopenia, we discussed medication therapy. Patient would prefer to stay off medication if possible. We ultimately agreed to above measures as well as repeat bone density in 1 year, if progression would start on alendronate next year.    >50% of 30 minute office visit was spent on counseling about osteoporosis, lifestyle changes, and decision making on therapy (helping patient through options) and coordination of care   F/u 6 months to discuss blood pressure, obtain labs

## 2014-07-28 ENCOUNTER — Encounter: Payer: Self-pay | Admitting: Family Medicine

## 2014-07-28 NOTE — Addendum Note (Signed)
Addended by: Marin Olp on: 07/28/2014 12:20 PM   Modules accepted: Orders

## 2014-08-02 ENCOUNTER — Encounter: Payer: Self-pay | Admitting: Family Medicine

## 2014-08-02 ENCOUNTER — Ambulatory Visit (INDEPENDENT_AMBULATORY_CARE_PROVIDER_SITE_OTHER): Payer: 59 | Admitting: Family Medicine

## 2014-08-02 VITALS — BP 158/96 | HR 86 | Ht 62.0 in | Wt 104.0 lb

## 2014-08-02 DIAGNOSIS — M810 Age-related osteoporosis without current pathological fracture: Secondary | ICD-10-CM

## 2014-08-02 NOTE — Patient Instructions (Signed)
Very nice to meet you Vitamin D 2000 IU daily, and calcium 1000mg  daily.  Glucosamine 1500mg  daily.  Exercise-  4 days a week.  Each day we will focus on new body part-  1 set of 15, 1 set of 12 and 1 set of 8 for each exercise.   Focus on the lengthening of the exercise.  1.  Arms and shoulder 2.  Chest 3. Back.  4. Legs.  Cardio after lifting. Would try rowing or elliptical or biking etc.  Goal time is 20-30 minutes. With eating When waking up 2 glasses of water immediatly and need to eat something.  After working out 4:1 ratio of carbs to protein within 30 minutes of working out (chocolate milk) or whey protein isolate with some frui tin milk.  Never go longer then 3 hours without eating something. Nuts, yogurt, cottage chesse etc.  Increasing protein when you can canhhelp  Before exercise try to have some simple carbs (fruit, cheese, gatorade) Come back in 4 weeks.

## 2014-08-02 NOTE — Assessment & Plan Note (Signed)
We discussed a significant amount of time about different changes patient can make that will be beneficial. We went into great detail about timing as well as exercises specifically to be helpful. We discussed proper technique of multiple different exercises that I think will be helpful. We discussed dividing different body groups in 2 days as well as decreasing the cardiovascular and increasing resistance training. We also discussed over-the-counter medications improper supplementation that can be helpful. We discussed diet modifications in timing of meals that could also be beneficial. Patient is going to make these adjustments over the course of the next 4 weeks and then come back and see me again further evaluation and treatment.

## 2014-08-02 NOTE — Progress Notes (Signed)
  [facility] [place], [place] [address] Phone: [phone] Subjective:    I'm seeing this patient by the request  of:  Garret Reddish, MD   CC:  Osteoporosis  [id number] Crystal [name] is a 58 y.o. female coming in with complaint of osteoporosis. Patient was found to have this newly diagnosed on bone scan.  Patient does have a T score less than negative 2 and multiple areas. Patient has started doing some calcium supplementation. Patient states that she has always been in fairly good health other than thyroid disease and hypertension. Patient is very active stating that she goes to the gym 3 times a week usually at night. Patient attempts to watch her diet and has been on a diet modification for her husband to decrease the cardiovascular risk she states. This causes him to have a decreased amount of carbohydrates as well as a decreased amount of protein. 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 Patient states that she does have daily aches and pains in multiple joints but nothing that stoppers from any activity. Patient has tried some over-the-counter medications with no significant benefit. Patient does not have a strong family history of osteoporosis but some pebbly history of cardiovascular risk is why she has always had focused on cardiovascular exercise. Patient had not been doing a lot of exercises except for her arms.  Patient also when she has time likes to play golf. Patient is looking for some direction to help her decrease her risk of progression of the osteoporosis and such things as compression fractures or worsening daily pain.    Past medical history, social, surgical and family history all reviewed in electronic medical record.   Review of Systems: No headache, visual changes, nausea, vomiting, diarrhea, constipation, dizziness, abdominal pain, skin rash, fevers, chills, night sweats, weight loss, swollen lymph nodes,  body aches, joint swelling, muscle aches, chest pain, shortness of breath, mood changes.   Objective Blood pressure 158/96, pulse 86, height 5\' 2"  (1.575 m), weight 104 lb (47.174 kg).  General: No apparent distress alert and oriented x3 mood and affect normal, dressed appropriately.  HEENT: Pupils equal, extraocular movements intact  Respiratory: Patient's speak in full sentences and does not appear short of breath  Cardiovascular: No lower extremity edema, non tender, no erythema  Skin: Warm dry intact with no signs of infection or rash on extremities or on axial skeleton.  Abdomen: Soft nontender  Neuro: Cranial nerves II through XII are intact, neurovascularly intact in all extremities with 2+ DTRs and 2+ pulses.  Lymph: No lymphadenopathy of posterior or anterior cervical chain or axillae bilaterally.  Gait normal with good balance and coordination.  MSK:  Non tender with full range of motion and good stability and symmetric strength and tone of shoulders, elbows, wrist, hip, knee and ankles bilaterally. . Minimal osteoarthritic changes of the hand. Body mass index is 19.02 kg/(m^2).    Impression and Recommendations:

## 2014-08-12 ENCOUNTER — Other Ambulatory Visit: Payer: Self-pay

## 2014-08-12 DIAGNOSIS — Z1231 Encounter for screening mammogram for malignant neoplasm of breast: Secondary | ICD-10-CM

## 2014-09-06 ENCOUNTER — Ambulatory Visit (INDEPENDENT_AMBULATORY_CARE_PROVIDER_SITE_OTHER): Payer: 59 | Admitting: Family Medicine

## 2014-09-06 ENCOUNTER — Encounter: Payer: Self-pay | Admitting: Family Medicine

## 2014-09-06 ENCOUNTER — Ambulatory Visit: Payer: 59 | Admitting: Family Medicine

## 2014-09-06 VITALS — BP 132/86 | HR 91 | Ht 62.0 in | Wt 104.0 lb

## 2014-09-06 DIAGNOSIS — Z23 Encounter for immunization: Secondary | ICD-10-CM

## 2014-09-06 DIAGNOSIS — M810 Age-related osteoporosis without current pathological fracture: Secondary | ICD-10-CM

## 2014-09-06 NOTE — Patient Instructions (Signed)
Good to see you Flu shot today.  Continue what you are doing.  Vitamin D is great Look for calcium citrate.  Continue the resistance training. 3 times a week and increase weight slowly.  When increasing weight decrease reps.  Then slowly increase reps back to 15/12/10 and then increase weight Continue the cardio as well consider sterching such as yoga 1 times a week.  Monitor the calcium reaction and if continues we can try a pill to decrease acid in your stomach, I don't think it will be necessary.  Come back and see me again in 6-8 weeks.

## 2014-09-06 NOTE — Assessment & Plan Note (Signed)
Discussed with patient at this time. She is doing very well with the calcium and vitamin D supplementation. Encourage her to continue to watch her diet closely. We discussed the benefits as well as the foods to potentially avoid. Patient did have good questions about alcohol and how that this could contribute to patient's blood pressure control. We also discussed certain medications and we could try patient had difficulty which patient declined. We discussed continuing to be active in how to increase the amount of resistance safely. Patient was given a new exercise prescription. Patient will follow up with me again in 6 weeks for further evaluation and treatment.  Spent greater than 25 minutes with patient face-to-face and had greater than 50% of counseling including as described above in assessment and plan.

## 2014-09-06 NOTE — Progress Notes (Signed)
  [facility] [place], [place] [address] Phone: [phone] Subjective:      CC:  Osteoporosis follow up  [id number] Crystal [name] is a 58 y.o. female coming in with complaint of osteoporosis. Patient was found to have this newly diagnosed on bone scan.  Patient does have a T score less than negative 2 and multiple areas. Patient has started doing some calcium supplementation. Patient states that she has always been in fairly good health other than thyroid disease and hypertension. Patient is very active stating that she goes to the gym 3 times a week usually at night. Patient also started doing more cardiovascular fitness to we discussed previously. Patient did make a changes and increase her protein supplementation especially after working out. We also gave patient recommendation of different resistance training. Patient states that overall she is improving. Patient states that her aches and pains have been cut by 50-60%. Patient does notice her balance and coordination and better. Patient is also playing the best golf she is placed in her life she states. Patient is very happy with the results so far. Patient denies any side effects any the over-the-counter medicines. Patient states that overall she is happy with the results.    Past medical history, social, surgical and family history all reviewed in electronic medical record.   Review of Systems: No headache, visual changes, nausea, vomiting, diarrhea, constipation, dizziness, abdominal pain, skin rash, fevers, chills, night sweats, weight loss, swollen lymph nodes, body aches, joint swelling, muscle aches, chest pain, shortness of breath, mood changes.   Objective Blood pressure 132/86, pulse 91, height 5\' 2"  (1.575 m), weight 104 lb (47.174 kg), SpO2 99.00%.  General: No apparent distress alert and oriented x3 mood and affect normal, dressed appropriately.  HEENT: Pupils equal, extraocular  movements intact  Respiratory: Patient's speak in full sentences and does not appear short of breath  Cardiovascular: No lower extremity edema, non tender, no erythema  Skin: Warm dry intact with no signs of infection or rash on extremities or on axial skeleton.  Abdomen: Soft nontender  Neuro: Cranial nerves II through XII are intact, neurovascularly intact in all extremities with 2+ DTRs and 2+ pulses.  Lymph: No lymphadenopathy of posterior or anterior cervical chain or axillae bilaterally.  Gait normal with good balance and coordination.  MSK:  Non tender with full range of motion and good stability and symmetric strength and tone of shoulders, elbows, wrist, hip, knee and ankles bilaterally. . Minimal osteoarthritic changes of the hand. Body mass index is 19.02 kg/(m^2). Change from previous exam   Impression and Recommendations:

## 2014-09-16 ENCOUNTER — Telehealth: Payer: Self-pay | Admitting: Family Medicine

## 2014-09-16 MED ORDER — AMITRIPTYLINE HCL 25 MG PO TABS: 25.0000 mg | ORAL_TABLET | Freq: Every day | ORAL | Status: DC

## 2014-09-16 NOTE — Telephone Encounter (Signed)
Medication refilled

## 2014-09-16 NOTE — Telephone Encounter (Signed)
[id number] [place] [name], [place] - [address] is requesting re-fill on amitriptyline (ELAVIL) 25 MG tablet

## 2014-09-23 ENCOUNTER — Ambulatory Visit
Admission: RE | Admit: 2014-09-23 | Discharge: 2014-09-23 | Disposition: A | Discharge status: Home / Self Care | Payer: 59 | Source: Ambulatory Visit

## 2014-09-23 DIAGNOSIS — Z1231 Encounter for screening mammogram for malignant neoplasm of breast: Secondary | ICD-10-CM

## 2014-09-29 ENCOUNTER — Other Ambulatory Visit: Payer: Self-pay

## 2014-09-29 ENCOUNTER — Telehealth: Payer: Self-pay

## 2014-09-29 MED ORDER — AMITRIPTYLINE HCL 25 MG PO TABS: 25.0000 mg | ORAL_TABLET | Freq: Every day | ORAL | Status: DC

## 2014-09-29 NOTE — Telephone Encounter (Signed)
Error

## 2014-10-05 ENCOUNTER — Other Ambulatory Visit: Payer: Self-pay

## 2014-10-05 DIAGNOSIS — Z1231 Encounter for screening mammogram for malignant neoplasm of breast: Secondary | ICD-10-CM

## 2014-10-18 ENCOUNTER — Encounter: Payer: Self-pay | Admitting: Family Medicine

## 2014-10-18 ENCOUNTER — Ambulatory Visit (INDEPENDENT_AMBULATORY_CARE_PROVIDER_SITE_OTHER): Payer: 59 | Admitting: Family Medicine

## 2014-10-18 VITALS — BP 120/82 | HR 97 | Ht 62.0 in | Wt 106.0 lb

## 2014-10-18 DIAGNOSIS — M810 Age-related osteoporosis without current pathological fracture: Secondary | ICD-10-CM

## 2014-10-18 NOTE — Patient Instructions (Signed)
Good to see you You are doing amazing overall.  Continue the meds and continue the weight training, you look great.  Greek yogurt with fairly low sugar content.  Balance-  Really have carbs, protein and fats with every meal.  Grilled chicken is great! Green leafy vegetables with at least dinner  Fish 1-2 times a week would be good (salmon, tilapia) Never wrong with nuts.  If having chocolate would have it with nuts in it,  Dark chocolate the better/.  Red wine most nights of the week.  See me when you need me.

## 2014-10-18 NOTE — Progress Notes (Signed)
  [facility] [address], [place] [address] Phone: [phone] Subjective:      CC:  Osteoporosis follow up  CXK:GYJEHUDJSH Crystal [name] is a 58 y.o. female coming in with complaint of osteoporosis. Patient was found to have this newly diagnosed on bone scan.  Patient does have a T score less than negative 2 and multiple areas. Patient has now been doing the over-the-counter supplementation including high-dose vitamin D. In addition this patient has been going to the gym on a very frequent basis and has been doing relatively well. Patient has noticed increase in strength and stamina. Patient states that also her dull aching which is very seldom has even decreased entirely. Patient states she has not had any side effects to the medications. Overall patient feels like she is doing remarkably well.    Past medical history, social, surgical and family history all reviewed in electronic medical record.   Review of Systems: No headache, visual changes, nausea, vomiting, diarrhea, constipation, dizziness, abdominal pain, skin rash, fevers, chills, night sweats, weight loss, swollen lymph nodes, body aches, joint swelling, muscle aches, chest pain, shortness of breath, mood changes.   Objective Blood pressure 120/82, pulse 97, height 5\' 2"  (1.575 m), weight 106 lb (48.081 kg), SpO2 99.00%.  General: No apparent distress alert and oriented x3 mood and affect normal, dressed appropriately.  HEENT: Pupils equal, extraocular movements intact  Respiratory: Patient's speak in full sentences and does not appear short of breath  Cardiovascular: No lower extremity edema, non tender, no erythema  Skin: Warm dry intact with no signs of infection or rash on extremities or on axial skeleton.  Abdomen: Soft nontender  Neuro: Cranial nerves II through XII are intact, neurovascularly intact in all extremities with 2+ DTRs and 2+ pulses.  Lymph: No lymphadenopathy of  posterior or anterior cervical chain or axillae bilaterally.  Gait normal with good balance and coordination.  MSK:  Non tender with full range of motion and good stability and symmetric strength and tone of shoulders, elbows, wrist, hip, knee and ankles bilaterally. . Minimal osteoarthritic changes of the hand. Body mass index is 19.38 kg/(m^2).    Impression and Recommendations:

## 2014-10-18 NOTE — Assessment & Plan Note (Signed)
Patient is doing remarkably well with conservative therapy at this time. We discussed the importance of continuing protein supplementation as well as weightbearing exercises. Discussed with patient no need to get another bone scan any earlier than 2 year interval and would even consider waiting for 5 year interval. Discussed with patient though with her BMI being normal and relatively on the low side potentially getting it elevated greater than 20 could be beneficial. Patient does have a fear of gaining weight which is likely one contribute intact her why patient has severe osteoarthritis at her age. Patient will continue to try to improve her diet and patient will follow-up again in 2-3 months for further evaluation.  Spent greater than 25 minutes with patient face-to-face and had greater than 50% of counseling including as described above in assessment and plan. Discussed in great detail patient's diet plan.

## 2015-01-18 ENCOUNTER — Ambulatory Visit: Payer: 59 | Admitting: Family Medicine

## 2015-05-24 ENCOUNTER — Other Ambulatory Visit: Payer: Self-pay

## 2015-05-30 ENCOUNTER — Other Ambulatory Visit: Payer: 59

## 2015-05-30 ENCOUNTER — Other Ambulatory Visit (INDEPENDENT_AMBULATORY_CARE_PROVIDER_SITE_OTHER): Payer: 59

## 2015-05-30 DIAGNOSIS — Z0000 Encounter for general adult medical examination without abnormal findings: Secondary | ICD-10-CM

## 2015-05-30 LAB — COMPREHENSIVE METABOLIC PANEL
ALK PHOS: 45 U/L (ref 39–117)
ALT: 18 U/L (ref 0–35)
AST: 23 U/L (ref 0–37)
Albumin: 4.2 g/dL (ref 3.5–5.2)
BILIRUBIN TOTAL: 0.5 mg/dL (ref 0.2–1.2)
BUN: 13 mg/dL (ref 6–23)
CALCIUM: 9 mg/dL (ref 8.4–10.5)
CO2: 29 mEq/L (ref 19–32)
Chloride: 100 mEq/L (ref 96–112)
Creatinine, Ser: 0.67 mg/dL (ref 0.40–1.20)
GFR: 95.74 mL/min (ref >60.00)
GLUCOSE: 98 mg/dL (ref 70–99)
Potassium: 3.7 mEq/L (ref 3.5–5.1)
Sodium: 135 mEq/L (ref 135–145)
Total Protein: 6.9 g/dL (ref 6.0–8.3)

## 2015-05-30 LAB — CBC WITH DIFFERENTIAL/PLATELET
Basophils Absolute: 0 10*3/uL (ref 0.0–0.1)
Basophils Relative: 0 % (ref 0.0–3.0)
EOS ABS: 0.3 10*3/uL (ref 0.0–0.7)
EOS PCT: 4.4 % (ref 0.0–5.0)
HCT: 40 % (ref 36.0–46.0)
Hemoglobin: 13.1 g/dL (ref 12.0–15.0)
Lymphocytes Relative: 35.2 % (ref 12.0–46.0)
Lymphs Abs: 2.2 10*3/uL (ref 0.7–4.0)
MCHC: 32.8 g/dL (ref 30.0–36.0)
MCV: 97.3 fl (ref 78.0–100.0)
MONO ABS: 0.9 10*3/uL (ref 0.1–1.0)
MONOS PCT: 13.9 % — AB (ref 3.0–12.0)
Neutro Abs: 2.9 10*3/uL (ref 1.4–7.7)
Neutrophils Relative %: 46.5 % (ref 43.0–77.0)
Platelets: 214 10*3/uL (ref 150.0–400.0)
RBC: 4.11 Mil/uL (ref 3.87–5.11)
RDW: 14.6 % (ref 11.5–15.5)
WBC: 6.2 10*3/uL (ref 4.0–10.5)

## 2015-05-30 LAB — POCT URINALYSIS DIPSTICK
Bilirubin, UA: NEGATIVE
Blood, UA: NEGATIVE
GLUCOSE UA: NEGATIVE
Ketones, UA: NEGATIVE
Leukocytes, UA: NEGATIVE
NITRITE UA: NEGATIVE
PH UA: 7
Protein, UA: NEGATIVE
SPEC GRAV UA: 1.01
Urobilinogen, UA: 0.2

## 2015-05-30 LAB — LIPID PANEL
CHOL/HDL RATIO: 2
CHOLESTEROL: 237 mg/dL — AB (ref 0–200)
HDL: 102.4 mg/dL (ref >39.00)
LDL Cholesterol: 122 mg/dL — ABNORMAL HIGH (ref 0–99)
NonHDL: 134.6
Triglycerides: 62 mg/dL (ref 0.0–149.0)
VLDL: 12.4 mg/dL (ref 0.0–40.0)

## 2015-05-30 LAB — TSH: TSH: 2.51 u[IU]/mL (ref 0.35–4.50)

## 2015-06-06 ENCOUNTER — Encounter: Payer: 59 | Admitting: Family Medicine

## 2015-06-06 ENCOUNTER — Encounter: Payer: Self-pay | Admitting: Family Medicine

## 2015-06-08 ENCOUNTER — Ambulatory Visit (INDEPENDENT_AMBULATORY_CARE_PROVIDER_SITE_OTHER): Payer: 59 | Admitting: Family Medicine

## 2015-06-08 ENCOUNTER — Other Ambulatory Visit (HOSPITAL_COMMUNITY)
Admission: RE | Admit: 2015-06-08 | Discharge: 2015-06-08 | Disposition: A | Discharge status: Home / Self Care | Payer: 59 | Source: Ambulatory Visit | Attending: Family Medicine | Admitting: Family Medicine

## 2015-06-08 ENCOUNTER — Encounter: Payer: Self-pay | Admitting: Family Medicine

## 2015-06-08 VITALS — BP 120/82 | HR 100 | Temp 98.5°F | Ht 62.0 in | Wt 104.0 lb

## 2015-06-08 DIAGNOSIS — E079 Disorder of thyroid, unspecified: Secondary | ICD-10-CM

## 2015-06-08 DIAGNOSIS — Z01411 Encounter for gynecological examination (general) (routine) with abnormal findings: Secondary | ICD-10-CM | POA: Insufficient documentation

## 2015-06-08 DIAGNOSIS — M810 Age-related osteoporosis without current pathological fracture: Secondary | ICD-10-CM

## 2015-06-08 DIAGNOSIS — Z01419 Encounter for gynecological examination (general) (routine) without abnormal findings: Secondary | ICD-10-CM

## 2015-06-08 DIAGNOSIS — IMO0001 Reserved for inherently not codable concepts without codable children: Secondary | ICD-10-CM

## 2015-06-08 DIAGNOSIS — Z0000 Encounter for general adult medical examination without abnormal findings: Secondary | ICD-10-CM

## 2015-06-08 NOTE — Progress Notes (Signed)
Crystal Reddish, MD Phone: [phone]  Subjective:  Patient presents today for their annual physical. Chief complaint-noted.   White coat hypertension-controlled based on home readings and verified cuff.   189/107 home cuff 182/102 my cuff Verified  BP Readings from Last 3 Encounters:  06/08/15 120/82  10/18/14 120/82  09/06/14 132/86   Home BP monitoring-normally gets 120s over mid 80s Compliant with medications-no medications  ROS- full  review of systems was completed and negative.  Pertinent to white coat HTN-Denies any CP, HA, SOB, blurry vision, LE edema, transient weakness, orthopnea, PND.  Pertinent to thyroid-No hair or nail changes. No heat/cold intolerance. No constipation or diarrhea. Denies shakiness or anxiety. No palpitations.   The following were reviewed and entered/updated in epic: Past Medical History  Diagnosis Date  . 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 White coat hypertension 07/26/2014  . Osteoporosis 07/26/2014  . Thyroid disease    No past surgical history on file.  Family History  Problem Relation Age of Onset  . Dementia Mother   . Heart disease Mother 24    sudden cardiac---?? heart block  . Heart disease Father     CABG  . Heart attack Father     Medications- reviewed and updated Current Outpatient Prescriptions  Medication Sig Dispense Refill  . amitriptyline (ELAVIL) 25 MG tablet Take 1 tablet (25 mg total) by mouth at bedtime. Take 1 or 2 tablet by mouth at bedtime 180 tablet 2  . Calcium-Vitamin D-Vitamin K (CALCIUM SOFT CHEWS PO) Take 2 each by mouth daily.      . Glucos-Chondroit-Hyaluron-MSM (GLUCOSAMINE CHONDROITIN JOINT PO) Take by mouth.     Allergies-reviewed and updated Allergies  Allergen Reactions  . E-Mycin [Erythromycin Base] Swelling  . Other Swelling    Adhesive tape, bandaids    History   Social History  . Marital Status: Married      Spouse Name: N/A  . Number of Children: N/A  . Years of Education: N/A   Social History Main Topics  . Smoking status: Never Smoker   . Smokeless tobacco: Not on file  . Alcohol Use: 8.4 oz/week    14 Glasses of wine per week  . Drug Use: No  . Sexual Activity: Not on file   Other Topics Concern  . 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 Thinking about retirement by age 31.   Eats healthy-kale, spinach beans   Married for 26 years. No kids. 6 cats (lost one 06/2014). 1 cat in chemo.     ROS--See HPI   Objective: BP 120/82 mmHg  Pulse 100  Temp(Src) 98.5 F (36.9 C)  Ht 5\' 2"  (1.575 m)  Wt 104 lb (47.174 kg)  BMI 19.02 kg/m2 Gen: NAD, resting comfortably HEENT: Mucous membranes are moist. Oropharynx normal Breasts: normal appearance, no masses or tenderness Pelvic: cervix normal in appearance, external genitalia normal, no adnexal masses or tenderness, no cervical motion tenderness, uterus normal size, shape, and consistency and vagina normal without discharge Neck: no thyromegaly CV: RRR no murmurs rubs or gallops Lungs: CTAB no crackles, wheeze, rhonchi Abdomen: soft/nontender/nondistended/normal bowel sounds. No rebound  or guarding.  Ext: no edema Skin: warm, dry Neuro: grossly normal, moves all extremities, PERRLA   Assessment/Plan:  59 y.o. female presenting for annual physical.  Health Maintenance counseling: 1. Anticipatory guidance: Patient counseled regarding regular dental exams, wearing sunscreen. Sees Dr. Delman Cheadle yearly.  2. Risk factor reduction:  Advised patient of need for regular exercise and diet rich and fruits and vegetables to reduce risk of heart attack and stroke.  3.  Immunizations/screenings/ancillary studies Health Maintenance Due  Topic Date Due  . HIV Screening - next labs 05/23/1971  . PAP SMEAR - today 04/21/2015   Mammogram was 09/2014 and normal- repeat 1 year. Breast exam normal today.   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 Thyroid normal without medication Lab Results  Component Value Date   TSH 2.51 05/30/2015     White coat hypertension Controlled on home readings and home cuff verified today. Continue without rx. BP elevations also unique to this office as at sports medicine when BP not focus, has #s in 120s and 130s.   Osteoporosis 07/20/14 bone density-Osteoporosis on 1 measure discovered-ca/vit D.. REpeat 07/20/2016.   Patient working on lifestyle changes- saw Dr. Charlann Boxer  1 year for CPE

## 2015-06-08 NOTE — Assessment & Plan Note (Signed)
07/20/14 bone density-Osteoporosis on 1 measure discovered-ca/vit D.. REpeat 07/20/2016.   Patient working on lifestyle changes- saw Dr. Charlann Boxer

## 2015-06-08 NOTE — Assessment & Plan Note (Signed)
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 Thyroid normal without medication Lab Results  Component Value Date   TSH 2.51 05/30/2015

## 2015-06-08 NOTE — Patient Instructions (Signed)
Osteoporosis- continue Exercise at least 30 minutes 3x a week. Thrilled you are working with Mr. Crystal [name]. I would recommend 1200mg  calcium and 800 IU vitamin D. Foods rich in calcium such as dairy would be a decent choice as well. Wait 2 years for repeat bone density  Blood pressure goal <140/90  Home cuff verified  Used your home cuff as measurement value for today  Continue to monitor at home at least weekly and if >140/90 come see [place] again  Health Maintenance Due  Topic Date Due  . HIV Screening - check next bloodwork 05/23/1971  . PAP SMEAR -Next pap 3 years if got a good specimen and normal 04/21/2015   Honestly we could probably do 1 year for physical but happy to see you in 6 months if desired

## 2015-06-08 NOTE — Assessment & Plan Note (Signed)
Controlled on home readings and home cuff verified today. Continue without rx. BP elevations also unique to this office as at sports medicine when BP not focus, has #s in 120s and 130s.

## 2015-06-12 LAB — CYTOLOGY - PAP

## 2015-09-26 ENCOUNTER — Ambulatory Visit: Payer: 59

## 2015-10-11 ENCOUNTER — Ambulatory Visit
Admission: RE | Admit: 2015-10-11 | Discharge: 2015-10-11 | Disposition: A | Discharge status: Home / Self Care | Payer: 59 | Source: Ambulatory Visit

## 2015-10-11 DIAGNOSIS — Z1231 Encounter for screening mammogram for malignant neoplasm of breast: Secondary | ICD-10-CM

## 2015-10-24 ENCOUNTER — Ambulatory Visit (INDEPENDENT_AMBULATORY_CARE_PROVIDER_SITE_OTHER): Payer: 59 | Admitting: *Deleted

## 2015-10-24 DIAGNOSIS — Z23 Encounter for immunization: Secondary | ICD-10-CM

## 2015-11-09 ENCOUNTER — Telehealth: Payer: Self-pay | Admitting: Family Medicine

## 2015-11-09 MED ORDER — AMITRIPTYLINE HCL 25 MG PO TABS: 25.0000 mg | ORAL_TABLET | Freq: Every day | ORAL | Status: DC

## 2015-11-09 NOTE — Telephone Encounter (Signed)
Refill sent in

## 2015-11-09 NOTE — Telephone Encounter (Signed)
Pt needs a refill on her amitriptyline. [facility] called [place] and someone in our office told them she was not a patient here. Needs filled today please.

## 2015-12-04 ENCOUNTER — Encounter: Payer: Self-pay | Admitting: Family Medicine

## 2015-12-04 MED ORDER — ACYCLOVIR 5 % EX CREA: TOPICAL_CREAM | CUTANEOUS | Status: DC

## 2015-12-05 ENCOUNTER — Other Ambulatory Visit: Payer: Self-pay

## 2015-12-05 NOTE — Telephone Encounter (Signed)
SKeba-May speak to pharmacy and authorize generic. I thought i sent it as generic so probably best just to call to verify generic is in instead of resending

## 2015-12-08 ENCOUNTER — Telehealth: Payer: Self-pay | Admitting: Family Medicine

## 2015-12-08 NOTE — Telephone Encounter (Signed)
error 

## 2016-04-01 ENCOUNTER — Encounter: Payer: Self-pay | Admitting: Family Medicine

## 2016-06-04 ENCOUNTER — Other Ambulatory Visit: Payer: 59

## 2016-06-11 ENCOUNTER — Encounter: Payer: 59 | Admitting: Family Medicine

## 2016-06-18 ENCOUNTER — Other Ambulatory Visit: Payer: 59

## 2016-06-19 ENCOUNTER — Other Ambulatory Visit: Payer: 59

## 2016-06-21 ENCOUNTER — Other Ambulatory Visit (INDEPENDENT_AMBULATORY_CARE_PROVIDER_SITE_OTHER): Payer: 59

## 2016-06-21 DIAGNOSIS — Z0000 Encounter for general adult medical examination without abnormal findings: Secondary | ICD-10-CM

## 2016-06-21 LAB — CBC WITH DIFFERENTIAL/PLATELET
BASOS PCT: 0.6 % (ref 0.0–3.0)
Basophils Absolute: 0 10*3/uL (ref 0.0–0.1)
EOS ABS: 0.2 10*3/uL (ref 0.0–0.7)
EOS PCT: 2.9 % (ref 0.0–5.0)
HEMATOCRIT: 37.5 % (ref 36.0–46.0)
HEMOGLOBIN: 12.7 g/dL (ref 12.0–15.0)
LYMPHS PCT: 35.1 % (ref 12.0–46.0)
Lymphs Abs: 2.1 10*3/uL (ref 0.7–4.0)
MCHC: 33.8 g/dL (ref 30.0–36.0)
MCV: 96 fl (ref 78.0–100.0)
MONO ABS: 0.6 10*3/uL (ref 0.1–1.0)
Monocytes Relative: 10.7 % (ref 3.0–12.0)
NEUTROS ABS: 3 10*3/uL (ref 1.4–7.7)
Neutrophils Relative %: 50.7 % (ref 43.0–77.0)
PLATELETS: 242 10*3/uL (ref 150.0–400.0)
RBC: 3.91 Mil/uL (ref 3.87–5.11)
RDW: 14 % (ref 11.5–15.5)
WBC: 6 10*3/uL (ref 4.0–10.5)

## 2016-06-21 LAB — HEPATIC FUNCTION PANEL
ALBUMIN: 4.3 g/dL (ref 3.5–5.2)
ALT: 17 U/L (ref 0–35)
AST: 31 U/L (ref 0–37)
Alkaline Phosphatase: 46 U/L (ref 39–117)
BILIRUBIN TOTAL: 0.6 mg/dL (ref 0.2–1.2)
Bilirubin, Direct: 0.1 mg/dL (ref 0.0–0.3)
Total Protein: 7 g/dL (ref 6.0–8.3)

## 2016-06-21 LAB — LIPID PANEL
CHOLESTEROL: 228 mg/dL — AB (ref 0–200)
HDL: 88.6 mg/dL (ref >39.00)
LDL Cholesterol: 124 mg/dL — ABNORMAL HIGH (ref 0–99)
NONHDL: 139.15
TRIGLYCERIDES: 74 mg/dL (ref 0.0–149.0)
Total CHOL/HDL Ratio: 3
VLDL: 14.8 mg/dL (ref 0.0–40.0)

## 2016-06-21 LAB — BASIC METABOLIC PANEL
BUN: 15 mg/dL (ref 6–23)
CALCIUM: 9.3 mg/dL (ref 8.4–10.5)
CO2: 27 mEq/L (ref 19–32)
Chloride: 101 mEq/L (ref 96–112)
Creatinine, Ser: 0.85 mg/dL (ref 0.40–1.20)
GFR: 72.49 mL/min (ref >60.00)
GLUCOSE: 86 mg/dL (ref 70–99)
POTASSIUM: 4 meq/L (ref 3.5–5.1)
Sodium: 137 mEq/L (ref 135–145)

## 2016-06-21 LAB — POC URINALSYSI DIPSTICK (AUTOMATED)
Bilirubin, UA: NEGATIVE
GLUCOSE UA: NEGATIVE
Ketones, UA: NEGATIVE
Leukocytes, UA: NEGATIVE
NITRITE UA: NEGATIVE
PH UA: 6
Protein, UA: NEGATIVE
Spec Grav, UA: 1.01
UROBILINOGEN UA: 0.2

## 2016-06-21 LAB — TSH: TSH: 4.33 u[IU]/mL (ref 0.35–4.50)

## 2016-07-02 ENCOUNTER — Encounter: Payer: 59 | Admitting: Family Medicine

## 2016-07-10 ENCOUNTER — Encounter: Payer: 59 | Admitting: Family Medicine

## 2016-07-17 ENCOUNTER — Encounter: Payer: 59 | Admitting: Family Medicine

## 2016-07-19 ENCOUNTER — Ambulatory Visit (INDEPENDENT_AMBULATORY_CARE_PROVIDER_SITE_OTHER): Payer: 59 | Admitting: Family Medicine

## 2016-07-19 ENCOUNTER — Encounter: Payer: Self-pay | Admitting: Family Medicine

## 2016-07-19 VITALS — BP 148/92 | HR 96 | Temp 98.2°F | Ht 62.0 in | Wt 107.6 lb

## 2016-07-19 DIAGNOSIS — R319 Hematuria, unspecified: Secondary | ICD-10-CM | POA: Diagnosis not present

## 2016-07-19 DIAGNOSIS — Z0000 Encounter for general adult medical examination without abnormal findings: Secondary | ICD-10-CM

## 2016-07-19 DIAGNOSIS — M810 Age-related osteoporosis without current pathological fracture: Secondary | ICD-10-CM | POA: Diagnosis not present

## 2016-07-19 LAB — URINALYSIS, MICROSCOPIC ONLY
RBC / HPF: NONE SEEN (ref 0–?)
WBC, UA: NONE SEEN (ref 0–?)

## 2016-07-19 NOTE — Progress Notes (Signed)
Pre visit review using our clinic review tool, if applicable. No additional management support is needed unless otherwise documented below in the visit note. 

## 2016-07-19 NOTE — Progress Notes (Signed)
Phone: [phone]  Subjective:  Patient presents today for their annual physical. Chief complaint-noted.   See problem oriented charting- ROS- full  review of systems was completed and negative including No chest pain or shortness of breath. No headache or blurry vision. Joint pain is better on joint juice advised by Dr. Tamala Julian  The following were reviewed and entered/updated in epic: Past Medical History:  Diagnosis Date  . Thyroid disease 1985   grave's dzs---treated with PTU   Patient Active Problem List   Diagnosis Date Noted  . White coat hypertension 07/26/2014  . Osteoporosis 07/26/2014  . Thyroid disease    No past surgical history on file.  Family History  Problem Relation Age of Onset  . Dementia Mother   . Heart disease Mother 24    sudden cardiac---?? heart block  . Heart disease Father     CABG  . Heart attack Father     Medications- reviewed and updated Current Outpatient Prescriptions  Medication Sig Dispense Refill  . amitriptyline (ELAVIL) 25 MG tablet Take 1 tablet (25 mg total) by mouth at bedtime. Take 1 or 2 tablet by mouth at bedtime 180 tablet 3  . Calcium-Vitamin D-Vitamin K (CALCIUM SOFT CHEWS PO) Take 2 each by mouth daily.      . Glucos-Chondroit-Hyaluron-MSM (GLUCOSAMINE CHONDROITIN JOINT PO) Take by mouth.     No current facility-administered medications for this visit.     Allergies-reviewed and updated Allergies  Allergen Reactions  . E-Mycin [Erythromycin Base] Swelling  . Other Swelling    Adhesive tape, bandaids    Social History   Social History  . Marital status: Married    Spouse name: N/A  . Number of children: N/A  . Years of education: N/A   Social History Main Topics  . Smoking status: Never Smoker  . Smokeless tobacco: Not on file  . Alcohol use 8.4 oz/week    14 Glasses of wine per week  . Drug use: No  . Sexual activity: Not on file   Other Topics Concern  . 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 Thinking about retirement by age 56.   Eats healthy-kale, spinach beans   Married for 26 years. No kids. 4 cats (lost one 06/2014, another in 2016). 1 cat in chemo in 2017 for 3 years    Objective: BP (!) 148/92   Pulse 96   Temp 98.2 F (36.8 C) (Oral)   Ht 5\' 2"  (1.575 m)   Wt 107 lb 9.6 oz (48.8 kg)   SpO2 98%   BMI 19.68 kg/m  Gen: NAD, resting comfortably, athletic appearing HEENT: Mucous membranes are moist. Oropharynx normal Neck: no thyromegaly CV: RRR no murmurs rubs or gallops Lungs: CTAB no crackles, wheeze, rhonchi Abdomen: soft/nontender/nondistended/normal bowel sounds. No rebound or guarding.  Ext: no edema Skin: warm, dry Neuro: grossly normal, moves all extremities, PERRLA  Breasts: normal appearance, no masses or tenderness   Assessment/Plan:  60 y.o. female presenting for annual physical.  Health Maintenance counseling: 1. Anticipatory guidance: Patient counseled regarding regular dental exams, eye exams, wearing seatbelts.  2. Risk factor reduction:  Advised patient of need for regular exercise and diet rich and fruits and vegetables  to reduce risk of heart attack and stroke. Monday nonweight bearing cardio, personal trainer Tuesday, Wednesday cardio, Thursday resistance work. Golf lessons as well.   3. Immunizations/screenings/ancillary studies Immunization History  Administered Date(s) Administered  . Influenza Split 10/21/2011, 10/26/2012  . Influenza,inj,Quad PF,36+ Mos 09/29/2013, 09/06/2014, 10/24/2015  . Tdap 04/20/2012   Health Maintenance Due  Topic Date Due  . Hepatitis C Screening - future labs, repeat discussion [birth year]02[birth year]  . HIV Screening  - future labs, repeat discussion  05/23/1971  . ZOSTAVAX - discussed, can check with insurance if opts in 05/22/2016   4. Cervical cancer screening- normal 06/08/15 with 3 year repeat planned 5. Breast cancer screening-  breast exam today normal and mammogram 10/11/15 6. Colon cancer screening - 05/03/11 with 10 year repeat 7. 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 Thyroid normal without medication Lab Results  Component Value Date   TSH 4.33 06/21/2016   Osteoporosis on one measure- working on lifestyle. Getting calcium/vit D. Repeat at present - wants to do at breast center  Hematuria- repeat urine today with micro   gential herpes- zovirax cream prn  Return in about 1 year (around 07/19/2017) for physical.  Orders Placed This Encounter  Procedures  . DG Bone Density    Standing Status:   Future    Standing Expiration Date:   09/19/2017    Order Specific Question:   Reason for Exam (SYMPTOM  OR DIAGNOSIS REQUIRED)    Answer:   osteoporosis follow up    Order Specific Question:   Is the patient pregnant?    Answer:   No    Order Specific Question:   Preferred imaging location?    Answer:   [facility]  . Urine Microscopic Only    Return precautions advised.   Garret Reddish, MD

## 2016-07-19 NOTE — Patient Instructions (Addendum)
Continue to monitor blood pressure at least every other week. If above 140/90 please return to see [place] but continues to look great at home  Thyroid looks fine  Call breast center to schedule bone density  Repeat urine test before you leave

## 2016-08-02 ENCOUNTER — Ambulatory Visit
Admission: RE | Admit: 2016-08-02 | Discharge: 2016-08-02 | Disposition: A | Discharge status: Home / Self Care | Payer: 59 | Source: Ambulatory Visit | Attending: Family Medicine | Admitting: Family Medicine

## 2016-08-02 DIAGNOSIS — M810 Age-related osteoporosis without current pathological fracture: Secondary | ICD-10-CM

## 2016-08-13 ENCOUNTER — Other Ambulatory Visit: Payer: Self-pay | Admitting: Family Medicine

## 2016-08-13 DIAGNOSIS — Z1231 Encounter for screening mammogram for malignant neoplasm of breast: Secondary | ICD-10-CM

## 2016-08-24 ENCOUNTER — Encounter: Payer: Self-pay | Admitting: Family Medicine

## 2016-08-30 ENCOUNTER — Other Ambulatory Visit: Payer: Self-pay

## 2016-08-30 MED ORDER — AMITRIPTYLINE HCL 25 MG PO TABS: 25.0000 mg | ORAL_TABLET | Freq: Every day | ORAL | 3 refills | Status: DC

## 2016-09-12 ENCOUNTER — Encounter: Payer: Self-pay | Admitting: Family Medicine

## 2016-09-13 MED ORDER — AMITRIPTYLINE HCL 25 MG PO TABS: 25.0000 mg | ORAL_TABLET | Freq: Every day | ORAL | 3 refills | Status: DC

## 2016-09-20 ENCOUNTER — Encounter: Payer: Self-pay | Admitting: Family Medicine

## 2016-09-24 ENCOUNTER — Ambulatory Visit (INDEPENDENT_AMBULATORY_CARE_PROVIDER_SITE_OTHER): Payer: 59

## 2016-09-24 DIAGNOSIS — Z23 Encounter for immunization: Secondary | ICD-10-CM | POA: Diagnosis not present

## 2016-10-07 ENCOUNTER — Encounter: Payer: Self-pay | Admitting: Family Medicine

## 2016-10-07 ENCOUNTER — Ambulatory Visit (INDEPENDENT_AMBULATORY_CARE_PROVIDER_SITE_OTHER): Payer: 59 | Admitting: Family Medicine

## 2016-10-07 VITALS — BP 144/88 | HR 104 | Temp 97.5°F | Wt 109.0 lb

## 2016-10-07 DIAGNOSIS — M810 Age-related osteoporosis without current pathological fracture: Secondary | ICD-10-CM | POA: Diagnosis not present

## 2016-10-07 DIAGNOSIS — K644 Residual hemorrhoidal skin tags: Secondary | ICD-10-CM

## 2016-10-07 MED ORDER — HYDROCORTISONE 2.5 % EX CREA: TOPICAL_CREAM | Freq: Two times a day (BID) | CUTANEOUS | 0 refills | Status: DC

## 2016-10-07 NOTE — Progress Notes (Signed)
Subjective:  Crystal [name] is a 60 y.o. year old very pleasant female patient who presents for/with See problem oriented charting ROS- no recent fractures, no blood in stool or rectal bleeding, no rectal pain.see any ROS included in HPI as well.   Past Medical History-  Patient Active Problem List   Diagnosis Date Noted  . Osteoporosis 07/26/2014    Priority: High  . White coat hypertension 07/26/2014    Priority: Medium  . Thyroid disease     Priority: Medium    Medications- reviewed and updated Current Outpatient Prescriptions  Medication Sig Dispense Refill  . amitriptyline (ELAVIL) 25 MG tablet Take 1 tablet (25 mg total) by mouth at bedtime. Take 1 or 2 tablet by mouth at bedtime 180 tablet 3  . Calcium-Vitamin D-Vitamin K (CALCIUM SOFT CHEWS PO) Take 2 each by mouth daily.      . Glucos-Chondroit-Hyaluron-MSM (GLUCOSAMINE CHONDROITIN JOINT PO) Take by mouth.    . hydrocortisone 2.5 % cream Apply topically 2 (two) times daily. 30 g 0   No current facility-administered medications for this visit.     Objective: BP (!) 144/88 (BP Location: Left Arm, Patient Position: Sitting, Cuff Size: Normal)   Pulse (!) 104   Temp 97.5 F (36.4 C) (Oral)   Wt 109 lb (49.4 kg)   SpO2 97%   BMI 19.94 kg/m  Gen: NAD, resting comfortably Rectum; at 3 o clock there is a 1cm by 5 mm hemorrhoid that is not significantly tender to touch.  Assessment/Plan:  Osteoporosis S: 07/20/14 bone density-Osteoporosis on 1 measure discovered-ca/vit D.. REpeat 07/20/2016. still -2.6 in left femur (only measure osteoporotic) A/P: we had a prolonged discussion today about osteoporosis, calcium, vit D goals, exercise, risk factors for osteoporosis. Patient wants to hold off on fosamax at present but may call if changes her mind.   also original intent of appointment was to discuss hemorrhoid present for a few days and not getting better but with minimal pain or itching. Hydrocortisone cream given.   Meds  ordered this encounter  Medications  . hydrocortisone 2.5 % cream    Sig: Apply topically 2 (two) times daily.    Dispense:  30 g    Refill:  0   The duration of face-to-face time during this visit was greater than 20 minutes. Greater than 50% of this time was spent in counseling on topics of osteoporosis  Return precautions advised.  Garret Reddish, MD

## 2016-10-07 NOTE — Patient Instructions (Addendum)
This is what I would write if you opt for fosamax.   You have osteoporosis.  At a minimum, recommend 800 IU of vitamin D and 1200mg  of Calcium per day. You can get this with a calcium-vitamin D supplement.  Once you have the above in place, I would start taking fosamax 70mg  once a week.  Administer first thing in the morning and >30 minutes before the first food, beverage (except plain water), or other medication of the day. Do not take with mineral water or with other beverages. Stay upright (not to lie down) for at least 30 minutes after taking medicine and until after first food of the day (to reduce irritation). Must be taken with 6 to 8 oz of plain water. The tablet should be swallowed whole; do not chew or suck.   For now- you are going to focus on getting calcium/vit D we discussed  Hemorrhoid- hydrocortisone cream twice a day for 7 daysshould help calm this down

## 2016-10-07 NOTE — Progress Notes (Signed)
Pre visit review using our clinic review tool, if applicable. No additional management support is needed unless otherwise documented below in the visit note. 

## 2016-10-07 NOTE — Assessment & Plan Note (Signed)
S: 07/20/14 bone density-Osteoporosis on 1 measure discovered-ca/vit D.. REpeat 07/20/2016. still -2.6 in left femur (only measure osteoporotic) A/P: we had a prolonged discussion today about osteoporosis, calcium, vit D goals, exercise, risk factors for osteoporosis. Patient wants to hold off on fosamax at present but may call if changes her mind.

## 2016-10-11 ENCOUNTER — Ambulatory Visit: Payer: 59

## 2016-10-18 ENCOUNTER — Ambulatory Visit: Payer: 59

## 2016-12-03 ENCOUNTER — Ambulatory Visit
Admission: RE | Admit: 2016-12-03 | Discharge: 2016-12-03 | Disposition: A | Discharge status: Home / Self Care | Payer: 59 | Source: Ambulatory Visit | Attending: Family Medicine | Admitting: Family Medicine

## 2016-12-03 DIAGNOSIS — Z1231 Encounter for screening mammogram for malignant neoplasm of breast: Secondary | ICD-10-CM

## 2017-01-31 DIAGNOSIS — H5000 Unspecified esotropia: Secondary | ICD-10-CM | POA: Diagnosis not present

## 2017-01-31 DIAGNOSIS — H53002 Unspecified amblyopia, left eye: Secondary | ICD-10-CM | POA: Diagnosis not present

## 2017-01-31 DIAGNOSIS — G43909 Migraine, unspecified, not intractable, without status migrainosus: Secondary | ICD-10-CM | POA: Diagnosis not present

## 2017-07-15 ENCOUNTER — Other Ambulatory Visit: Payer: 59

## 2017-07-22 ENCOUNTER — Encounter: Payer: 59 | Admitting: Family Medicine

## 2017-07-24 ENCOUNTER — Other Ambulatory Visit: Payer: 59

## 2017-07-30 ENCOUNTER — Encounter: Payer: 59 | Admitting: Family Medicine

## 2017-09-08 ENCOUNTER — Encounter: Payer: 59 | Admitting: Family Medicine

## 2017-10-07 ENCOUNTER — Ambulatory Visit (INDEPENDENT_AMBULATORY_CARE_PROVIDER_SITE_OTHER): Payer: 59

## 2017-10-07 DIAGNOSIS — Z23 Encounter for immunization: Secondary | ICD-10-CM | POA: Diagnosis not present

## 2017-10-24 ENCOUNTER — Other Ambulatory Visit: Payer: Self-pay | Admitting: Family Medicine

## 2017-10-24 DIAGNOSIS — Z1231 Encounter for screening mammogram for malignant neoplasm of breast: Secondary | ICD-10-CM

## 2017-11-12 ENCOUNTER — Encounter: Payer: 59 | Admitting: Family Medicine

## 2017-12-04 ENCOUNTER — Encounter: Payer: Self-pay | Admitting: Family Medicine

## 2017-12-04 ENCOUNTER — Ambulatory Visit: Payer: 59

## 2017-12-05 ENCOUNTER — Ambulatory Visit (INDEPENDENT_AMBULATORY_CARE_PROVIDER_SITE_OTHER): Payer: 59 | Admitting: Family Medicine

## 2017-12-05 ENCOUNTER — Encounter: Payer: Self-pay | Admitting: Family Medicine

## 2017-12-05 VITALS — BP 140/90 | HR 88 | Temp 98.0°F | Ht 62.0 in | Wt 110.0 lb

## 2017-12-05 DIAGNOSIS — J329 Chronic sinusitis, unspecified: Secondary | ICD-10-CM | POA: Diagnosis not present

## 2017-12-05 DIAGNOSIS — R030 Elevated blood-pressure reading, without diagnosis of hypertension: Secondary | ICD-10-CM | POA: Diagnosis not present

## 2017-12-05 MED ORDER — AMOXICILLIN 400 MG/5ML PO SUSR: 800.0000 mg | Freq: Two times a day (BID) | ORAL | 0 refills | Status: DC

## 2017-12-05 MED ORDER — IPRATROPIUM BROMIDE 0.06 % NA SOLN: 2.0000 | Freq: Four times a day (QID) | NASAL | 0 refills | Status: DC

## 2017-12-05 NOTE — Patient Instructions (Signed)
Start the atrovent. You can use 3-4 times daily for the next 1-2 weeks.  If no improvement by Monday, please start the antibiotic.  Take care,  Dr Jerline Pain

## 2017-12-05 NOTE — Progress Notes (Signed)
    Subjective:  Crystal [name] is a 61 y.o. female who presents today with a chief complaint of sinus congestion.   HPI:  Sinus congestion, Acute issue  Symptoms about 4 or 5 days ago. Symptoms have stayed same over that time. Initially had a sore throat that has since resolved. Associated symptoms also include rhinorrhea and productive cough. No fevers or chills. No pain. No sick contacts.  No treatments tried.  No obvious precipitating factors.  No obvious alleviating or aggravating factors.  ROS: Per HPI  PMH: Smoking history reviewed. Never smoker.   Objective:  Physical Exam: BP (!) 160/90 (BP Location: Left Arm, Patient Position: Sitting, Cuff Size: Normal)   Pulse 88   Temp 98 F (36.7 C) (Oral)   Ht 5\' 2"  (1.575 m)   Wt 110 lb (49.9 kg)   SpO2 100%   BMI 20.12 kg/m   Gen: NAD, resting comfortably HEENT: TMs are clear effusions bilaterally.  Maxillary sinuses mildly decreased transillumination bilaterally.  Nasal mucosae erythematous with clear nasal discharge.  Oropharynx clear.  No lymphadenopathy. CV: RRR with no murmurs appreciated Pulm: NWOB, CTAB with no crackles, wheezes, or rhonchi  Assessment/Plan:  Acute sinusitis No clear signs of bacterial infection today. Start Atrovent nasal spray.  Gave "pocket prescription" for amoxicillin.  Instructed patient to start if symptoms do not improve within the next 3-4 days or if symptoms worsen.  Encouraged good oral hydration.  Recommended Tylenol and/or Motrin as needed for low-grade fever and pain.  Return precautions reviewed.  Follow-up as needed.  White Coat Hypertension Elevated today.  Per patient typically in the 120s over 80s at home.  Advised continue home blood pressure monitoring with goal 140/90 or less.  Algis Greenhouse. Jerline Pain, MD 12/05/2017 11:04 AM

## 2018-01-13 DIAGNOSIS — L814 Other melanin hyperpigmentation: Secondary | ICD-10-CM | POA: Diagnosis not present

## 2018-01-13 DIAGNOSIS — L738 Other specified follicular disorders: Secondary | ICD-10-CM | POA: Diagnosis not present

## 2018-01-13 DIAGNOSIS — D2271 Melanocytic nevi of right lower limb, including hip: Secondary | ICD-10-CM | POA: Diagnosis not present

## 2018-01-15 ENCOUNTER — Other Ambulatory Visit (HOSPITAL_COMMUNITY)
Admission: RE | Admit: 2018-01-15 | Discharge: 2018-01-15 | Disposition: A | Discharge status: Home / Self Care | Payer: 59 | Source: Ambulatory Visit | Attending: Family Medicine | Admitting: Family Medicine

## 2018-01-15 ENCOUNTER — Ambulatory Visit (INDEPENDENT_AMBULATORY_CARE_PROVIDER_SITE_OTHER): Payer: 59 | Admitting: Family Medicine

## 2018-01-15 ENCOUNTER — Ambulatory Visit
Admission: RE | Admit: 2018-01-15 | Discharge: 2018-01-15 | Disposition: A | Discharge status: Home / Self Care | Payer: 59 | Source: Ambulatory Visit | Attending: Family Medicine | Admitting: Family Medicine

## 2018-01-15 ENCOUNTER — Encounter: Payer: Self-pay | Admitting: Family Medicine

## 2018-01-15 VITALS — BP 150/88 | HR 102 | Temp 98.4°F | Ht 62.0 in | Wt 109.6 lb

## 2018-01-15 DIAGNOSIS — E785 Hyperlipidemia, unspecified: Secondary | ICD-10-CM | POA: Diagnosis not present

## 2018-01-15 DIAGNOSIS — Z124 Encounter for screening for malignant neoplasm of cervix: Secondary | ICD-10-CM | POA: Diagnosis not present

## 2018-01-15 DIAGNOSIS — E079 Disorder of thyroid, unspecified: Secondary | ICD-10-CM

## 2018-01-15 DIAGNOSIS — Z0000 Encounter for general adult medical examination without abnormal findings: Secondary | ICD-10-CM | POA: Diagnosis not present

## 2018-01-15 DIAGNOSIS — Z1231 Encounter for screening mammogram for malignant neoplasm of breast: Secondary | ICD-10-CM | POA: Diagnosis not present

## 2018-01-15 LAB — CBC
HCT: 39.7 % (ref 36.0–46.0)
Hemoglobin: 13.4 g/dL (ref 12.0–15.0)
MCHC: 33.7 g/dL (ref 30.0–36.0)
MCV: 99.1 fl (ref 78.0–100.0)
Platelets: 278 10*3/uL (ref 150.0–400.0)
RBC: 4.01 Mil/uL (ref 3.87–5.11)
RDW: 13.9 % (ref 11.5–15.5)
WBC: 6.4 10*3/uL (ref 4.0–10.5)

## 2018-01-15 LAB — POC URINALSYSI DIPSTICK (AUTOMATED)
Bilirubin, UA: NEGATIVE
Blood, UA: NEGATIVE
Glucose, UA: NEGATIVE
Ketones, UA: NEGATIVE
LEUKOCYTES UA: NEGATIVE
Nitrite, UA: NEGATIVE
PH UA: 6.5 (ref 5.0–8.0)
PROTEIN UA: NEGATIVE
SPEC GRAV UA: 1.01 (ref 1.010–1.025)
UROBILINOGEN UA: 0.2 U/dL

## 2018-01-15 LAB — COMPREHENSIVE METABOLIC PANEL
ALBUMIN: 4.5 g/dL (ref 3.5–5.2)
ALT: 13 U/L (ref 0–35)
AST: 23 U/L (ref 0–37)
Alkaline Phosphatase: 46 U/L (ref 39–117)
BILIRUBIN TOTAL: 0.4 mg/dL (ref 0.2–1.2)
BUN: 13 mg/dL (ref 6–23)
CALCIUM: 9.3 mg/dL (ref 8.4–10.5)
CO2: 30 mEq/L (ref 19–32)
CREATININE: 0.64 mg/dL (ref 0.40–1.20)
Chloride: 99 mEq/L (ref 96–112)
GFR: 100.05 mL/min (ref >60.00)
Glucose, Bld: 96 mg/dL (ref 70–99)
Potassium: 4.7 mEq/L (ref 3.5–5.1)
Sodium: 137 mEq/L (ref 135–145)
TOTAL PROTEIN: 7.2 g/dL (ref 6.0–8.3)

## 2018-01-15 LAB — LIPID PANEL
CHOLESTEROL: 248 mg/dL — AB (ref 0–200)
HDL: 98 mg/dL (ref >39.00)
LDL Cholesterol: 137 mg/dL — ABNORMAL HIGH (ref 0–99)
NonHDL: 150.23
TRIGLYCERIDES: 64 mg/dL (ref 0.0–149.0)
Total CHOL/HDL Ratio: 3
VLDL: 12.8 mg/dL (ref 0.0–40.0)

## 2018-01-15 LAB — TSH: TSH: 2.64 u[IU]/mL (ref 0.35–4.50)

## 2018-01-15 MED ORDER — AMITRIPTYLINE HCL 25 MG PO TABS: 25.0000 mg | ORAL_TABLET | Freq: Every day | ORAL | 3 refills | Status: DC

## 2018-01-15 NOTE — Addendum Note (Signed)
Addended by: Frutoso Chase A on: 01/15/2018 10:43 AM   Modules accepted: Orders

## 2018-01-15 NOTE — Patient Instructions (Addendum)
Shingrix- 2 part shingles shot- if you find this at your pharmacy and receive it please let us know  Send me a message in late July or august and we will set up you rnext bone density test. Also update me on how blood pressures are doing at home.   Please stop by lab before you go

## 2018-01-15 NOTE — Progress Notes (Signed)
Phone: [phone]  Subjective:  Patient presents today for their annual physical. Chief complaint-noted.   See problem oriented charting- ROS- full  review of systems was completed and negative except for: palpitations twice for a few seconds  The following were reviewed and entered/updated in epic: Past Medical History:  Diagnosis Date  . Thyroid disease 1985   grave's dzs---treated with PTU  . White coat hypertension    Patient Active Problem List   Diagnosis Date Noted  . Osteoporosis 07/26/2014    Priority: High  . Elevated blood pressure reading in office with white coat syndrome, without diagnosis of hypertension 07/26/2014    Priority: Medium  . Thyroid disease     Priority: Medium  . Hyperlipidemia 01/15/2018   History reviewed. No pertinent surgical history.  Family History  Problem Relation Age of Onset  . Dementia Mother   . Heart disease Mother 85       sudden cardiac---?? heart block  . Heart disease Father        CABG  . Heart attack Father     Medications- reviewed and updated Current Outpatient Medications  Medication Sig Dispense Refill  . amitriptyline (ELAVIL) 25 MG tablet Take 1 tablet (25 mg total) by mouth at bedtime. Take 1 or 2 tablet by mouth at bedtime 180 tablet 3  . Calcium-Vitamin D-Vitamin K (CALCIUM SOFT CHEWS PO) Take 2 each by mouth daily.      . Glucos-Chondroit-Hyaluron-MSM (GLUCOSAMINE CHONDROITIN JOINT PO) Take by mouth.    . hydrocortisone 2.5 % cream Apply topically 2 (two) times daily. 30 g 0   No current facility-administered medications for this visit.     Allergies-reviewed and updated Allergies  Allergen Reactions  . E-Mycin [Erythromycin Base] Swelling  . Other Swelling    Adhesive tape, bandaids    Social History   Socioeconomic History  . Marital status: Married    Spouse name: None  . Number of children: None  . Years of education: None  . Highest education level: None  Social Needs  . Financial  resource strain: None  . Food insecurity - worry: None  . Food insecurity - inability: None  . Transportation needs - medical: None  . Transportation needs - non-medical: None  Occupational History  . None  Tobacco Use  . Smoking status: Never Smoker  . Smokeless tobacco: Never Used  Substance and Sexual Activity  . Alcohol use: Yes    Alcohol/week: 8.4 oz    Types: 14 Glasses of wine per week  . Drug use: No  . Sexual activity: None  Other Topics Concern  . None  Social History Narrative   Married for 26 years. No kids. 4 cats (lost one 06/2014, another in 2016). 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 She is starting to wind down late 2018      Socializes way too little reportedly   Loves to exercise, [place] [employer] [place] boating, has place in [place]. Thinking about retirement by age 19.   Eats healthy-kale, spinach beans    Objective: BP (!) 150/88 (BP Location: Left Arm, Patient Position: Sitting, Cuff Size: Large)   Pulse (!) 102   Temp 98.4 F (36.9 C) (Oral)   Ht 5\' 2"  (1.575 m)  Wt 109 lb 9.6 oz (49.7 kg)   SpO2 98%   BMI 20.05 kg/m  Gen: NAD, resting comfortably HEENT: Mucous membranes are moist. Oropharynx normal Neck: no thyromegaly CV: RRR no murmurs rubs or gallops Lungs: CTAB no crackles, wheeze, rhonchi Abdomen: soft/nontender/nondistended/normal bowel sounds. No rebound or guarding.  Ext: no edema Skin: warm, dry Neuro: grossly normal, moves all extremities, PERRLA  Breasts: normal appearance, no masses or tenderness  Pelvic: cervix normal in appearance, external genitalia normal, no adnexal masses or tenderness, no cervical motion tenderness, uterus normal size, shape, and consistency and vagina normal without discharge  Assessment/Plan:  62 y.o.  female presenting for annual physical.  Health Maintenance counseling: 1. Anticipatory guidance: Patient counseled regarding regular dental exams -q6 months, eye exams -sees each year in february, wearing seatbelts.  2. Risk factor reduction:  Advised patient of need for regular exercise and diet rich and fruits and vegetables to reduce risk of heart attack and stroke. Exercise- walking very briskly most days. Also wants to get back into the gym.  Diet-very healthy weight-  Wt Readings from Last 3 Encounters:  01/15/18 109 lb 9.6 oz (49.7 kg)  12/05/17 110 lb (49.9 kg)  10/07/16 109 lb (49.4 kg)  3. Immunizations/screenings/ancillary studies- discussed shingrix and hcv/hiv screen Immunization History  Administered Date(s) Administered  . Influenza Split 10/21/2011, 10/26/2012  . Influenza,inj,Quad PF,6+ Mos 09/29/2013, 09/06/2014, 10/24/2015, 09/24/2016, 10/07/2017  . Tdap 04/20/2012  4. Cervical cancer screening- normal 06/08/15 with 3 year repeat needed- we will do today with HPV and if both negative- she will be done with screening as next test would be 65 5. Breast cancer screening-  breast exam today  and mammogram 12/03/16 and repeat today at 11 20 AM 6. Colon cancer screening - 05/03/11 with 10 year repeat 7. Skin cancer screening- sees dermatology once a year . advised regular sunscreen use. Denies worrisome, changing, or new skin lesions.  8. Birth control/STD check- postmenopausal. monogamous declines need for std testing 9. Osteoporosis - known diagnosis-diagnosed 2015- she has wanted to work on weight bearing exercises, calcium and diet and vitamin D. Was stable 06/2016 at -2.6 in left femur- we will repeat 06/2018 or later  Status of chronic or acute concerns  History of graves disease treated with PTU in past. No rx. Update tsh  White coat hypertension-  122/78 last week at home. 120s or 130s 90% of the time at home. She states we have verified home cuff in past. She will update me  in 6 months with home #s  Genital herpes- zovirax cream prn in past- no recent issues and does not need refill  Mild hyperlipidemia- but excellent hdl. Using last years lipids- 10 year risk 4.1%- update today  Insomnia- sparing use of amitriptyline for stress/anxiety/sleep- refill today  Did have 2 mornings in [place] when dealing with high work stress- where she had high HR in the morning for a few seconds but resolved on its own. No chest pain or shortness of breath. We discussed if continues to have these or has new or worsening symptoms to follow up with [place] and we could consider cardiac monitor  Future Appointments  Date Time Provider [place]  01/15/2018 11:20 AM [facility] 3 [facility]   Return in about 1 year (around 01/15/2019) for physical.  Lab/Order associations: Preventative health care - Plan: CBC, Comprehensive metabolic panel, Lipid panel, POCT Urinalysis Dipstick (Automated), TSH  Hyperlipidemia, unspecified hyperlipidemia type - Plan: CBC, Comprehensive metabolic panel,  Lipid panel, POCT Urinalysis Dipstick (Automated), TSH  Thyroid disease - Plan: TSH  Screening for cervical cancer - Plan: Cytology - PAP  Meds ordered this encounter  Medications  . amitriptyline (ELAVIL) 25 MG tablet    Sig: Take 1 tablet (25 mg total) by mouth at bedtime. Take 1 or 2 tablet by mouth at bedtime    Dispense:  180 tablet    Refill:  3   Return precautions advised.  Garret Reddish, MD

## 2018-01-16 ENCOUNTER — Encounter: Payer: Self-pay | Admitting: Family Medicine

## 2018-01-16 LAB — CYTOLOGY - PAP
Diagnosis: NEGATIVE
HPV: NOT DETECTED

## 2018-01-19 ENCOUNTER — Encounter: Payer: Self-pay | Admitting: Family Medicine

## 2018-02-23 DIAGNOSIS — G43909 Migraine, unspecified, not intractable, without status migrainosus: Secondary | ICD-10-CM | POA: Diagnosis not present

## 2018-08-13 ENCOUNTER — Other Ambulatory Visit: Payer: Self-pay

## 2018-08-13 ENCOUNTER — Encounter: Payer: Self-pay | Admitting: Family Medicine

## 2018-08-13 MED ORDER — IPRATROPIUM BROMIDE 0.06 % NA SOLN: 2.0000 | Freq: Four times a day (QID) | NASAL | 1 refills | Status: DC

## 2018-10-01 ENCOUNTER — Ambulatory Visit (INDEPENDENT_AMBULATORY_CARE_PROVIDER_SITE_OTHER): Payer: 59

## 2018-10-01 DIAGNOSIS — Z23 Encounter for immunization: Secondary | ICD-10-CM

## 2018-10-01 NOTE — Progress Notes (Unsigned)
Patient in today for flu vaccine. VIS given. Administered in right arm. Patient tolerated well

## 2018-10-01 NOTE — Progress Notes (Signed)
I have reviewed the patient's encounter and agree with the documentation.  Algis Greenhouse. Jerline Pain, MD 10/01/2018 5:23 PM

## 2018-10-01 NOTE — Patient Instructions (Signed)
There are no preventive care reminders to display for this patient.  Depression screen [facility] 2/9 01/15/2018  Decreased Interest 0  Down, Depressed, Hopeless 0  PHQ - 2 Score 0

## 2018-12-07 ENCOUNTER — Encounter: Payer: Self-pay | Admitting: Family Medicine

## 2018-12-17 ENCOUNTER — Ambulatory Visit (INDEPENDENT_AMBULATORY_CARE_PROVIDER_SITE_OTHER): Payer: 59

## 2018-12-17 DIAGNOSIS — Z23 Encounter for immunization: Secondary | ICD-10-CM

## 2018-12-17 NOTE — Progress Notes (Signed)
Patient in office today for Shingrix #1. Injection given in right arm. Reviewed all information answered all questions. Vis given for immunization. Patient will make appointment for second injection at checkout.

## 2019-01-06 ENCOUNTER — Encounter: Payer: Self-pay | Admitting: Family Medicine

## 2019-01-07 ENCOUNTER — Other Ambulatory Visit: Payer: Self-pay

## 2019-01-07 ENCOUNTER — Other Ambulatory Visit: Payer: Self-pay | Admitting: Family Medicine

## 2019-01-07 DIAGNOSIS — E2839 Other primary ovarian failure: Secondary | ICD-10-CM

## 2019-01-07 DIAGNOSIS — Z1231 Encounter for screening mammogram for malignant neoplasm of breast: Secondary | ICD-10-CM

## 2019-01-14 ENCOUNTER — Other Ambulatory Visit: Payer: Self-pay

## 2019-01-14 ENCOUNTER — Encounter: Payer: Self-pay | Admitting: Family Medicine

## 2019-01-14 MED ORDER — AMITRIPTYLINE HCL 25 MG PO TABS: 25.0000 mg | ORAL_TABLET | Freq: Every day | ORAL | 3 refills | Status: DC

## 2019-03-02 ENCOUNTER — Ambulatory Visit
Admission: RE | Admit: 2019-03-02 | Discharge: 2019-03-02 | Disposition: A | Discharge status: Home / Self Care | Payer: 59 | Source: Ambulatory Visit | Attending: Family Medicine | Admitting: Family Medicine

## 2019-03-02 ENCOUNTER — Ambulatory Visit (INDEPENDENT_AMBULATORY_CARE_PROVIDER_SITE_OTHER): Payer: 59 | Admitting: *Deleted

## 2019-03-02 ENCOUNTER — Encounter: Payer: Self-pay | Admitting: *Deleted

## 2019-03-02 DIAGNOSIS — Z780 Asymptomatic menopausal state: Secondary | ICD-10-CM | POA: Diagnosis not present

## 2019-03-02 DIAGNOSIS — M8589 Other specified disorders of bone density and structure, multiple sites: Secondary | ICD-10-CM | POA: Diagnosis not present

## 2019-03-02 DIAGNOSIS — E2839 Other primary ovarian failure: Secondary | ICD-10-CM

## 2019-03-02 DIAGNOSIS — Z23 Encounter for immunization: Secondary | ICD-10-CM | POA: Diagnosis not present

## 2019-03-02 DIAGNOSIS — Z1231 Encounter for screening mammogram for malignant neoplasm of breast: Secondary | ICD-10-CM

## 2019-03-02 NOTE — Progress Notes (Signed)
Per orders of Dr. Yong Channel, injection of Shingrix 0.5 ml IM given by Anselmo Pickler in left deltoid. Patient tolerated injection well. Pt has completed series.

## 2019-04-01 ENCOUNTER — Telehealth: Payer: Self-pay

## 2019-04-01 NOTE — Telephone Encounter (Signed)
Called pt to see if an ov can be scheduled. Pt has not been seen since 01/15/2018

## 2019-04-02 NOTE — Telephone Encounter (Signed)
Patient called back to say that she is fine and wanted to know what kind of visit she need. She stated that she will not come out to see a doctor I did inform her that she could possibly do a virtual visit. Please call patient to follow up on 04/05/2019

## 2019-04-05 NOTE — Telephone Encounter (Signed)
See note

## 2019-04-05 NOTE — Telephone Encounter (Signed)
Pt has been scheduled for CPE. No further action needed!

## 2019-06-29 ENCOUNTER — Encounter: Payer: 59 | Admitting: Family Medicine

## 2019-08-06 NOTE — Patient Instructions (Addendum)
Health Maintenance Due  Topic Date Due  . INFLUENZA VACCINE  07/24/2019  We should have flu shots available by September. Please strongly consider getting flu shot this year. If you get your flu shot at a pharmacy- please let us know.   Please continue to monitor blood pressure at home at least 2-4x a month with goal <140/90 average  For cholesterol - will calculate 10 year ascvd risk once we get your #s back today

## 2019-08-06 NOTE — Progress Notes (Signed)
Phone: [phone]   Subjective:  Patient presents today for their annual physical. Chief complaint-noted.   See problem oriented charting- ROS- full  review of systems was completed and negative except for: occasional aches/pains related to golf  The following were reviewed and entered/updated in epic: Past Medical History:  Diagnosis Date  . Thyroid disease 1985   grave's dzs---treated with PTU  . White coat hypertension    Patient Active Problem List   Diagnosis Date Noted  . Osteoporosis 07/26/2014    Priority: High  . Elevated blood pressure reading in office with white coat syndrome, without diagnosis of hypertension 07/26/2014    Priority: Medium  . Thyroid disease     Priority: Medium  . Hyperlipidemia 01/15/2018   History reviewed. No pertinent surgical history.  Family History  Problem Relation Age of Onset  . Dementia Mother   . Heart disease Mother 79       sudden cardiac---?? heart block  . Heart disease Father        CABG x2, apparently 50rd open heart surgery.   Marland Kitchen Heart attack Father   . Stroke Sister        age 46. healthy prior to this- [place]  . Hypertension Sister   . Atrial fibrillation Sister     Medications- reviewed and updated Current Outpatient Medications  Medication Sig Dispense Refill  . amitriptyline (ELAVIL) 25 MG tablet Take 1 tablet (25 mg total) by mouth at bedtime. Take 1 or 2 tablet by mouth at bedtime 180 tablet 3  . Calcium-Vitamin D-Vitamin K (CALCIUM SOFT CHEWS PO) Take 2 each by mouth daily.      Marland Kitchen VITAMIN D PO Take by mouth.     No current facility-administered medications for this visit.     Allergies-reviewed and updated Allergies  Allergen Reactions  . E-Mycin [Erythromycin Base] Swelling  . Other Swelling    Adhesive tape, bandaids    Social History   Social History Narrative   Married for 26 years. No kids. 4 cats (lost one 06/2014, another in 2016). 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 She is starting to wind down late 2018      Socializes way too little reportedly   Loves to exercise, [place] [employer] [place] boating, has place in [place]. Thinking about retirement by age 73.   Eats healthy-kale, spinach beans   Objective  Objective:  BP 136/82 Comment: home average over last 14 readings  Pulse 90   Temp 97.9 F (36.6 C) (Oral)   Ht 5\' 2"  (1.575 m)   Wt 105 lb 9.6 oz (47.9 kg)   SpO2 100%   BMI 19.31 kg/m  Gen: NAD, resting comfortably HEENT: Mucous membranes are moist. Oropharynx normal Neck: no thyromegaly or cervical lymphadenopathy CV: RRR no murmurs rubs or gallops Lungs: CTAB no crackles, wheeze, rhonchi Abdomen: soft/nontender/nondistended/normal bowel sounds. No rebound or guarding.  Ext: no edema Skin: warm, dry Neuro: grossly normal, moves all extremities, PERRLA    Assessment and Plan    63 y.o. female presenting for annual physical.  Health Maintenance counseling: 1. Anticipatory guidance: Patient counseled regarding regular dental exams -q6 months, eye exams - q2 yearly,  avoiding smoking and second hand smoke , limiting alcohol to  1 beverage per day- under that .   2. Risk factor reduction:  Advised patient of need for regular exercise and diet rich and fruits and vegetables to reduce risk of heart attack and stroke. Exercise- still doing a great job with weight bearing exercise. Diet-very healthy diet.  Wt Readings from Last 3 Encounters:  08/09/19 105 lb 9.6 oz (47.9 kg)  01/15/18 109 lb 9.6 oz (49.7 kg)  12/05/17 110 lb (49.9 kg)  3. Immunizations/screenings/ancillary studies-recommended fall flu shot. Immunization History  Administered Date(s) Administered  . Influenza Split 10/21/2011, 10/26/2012  . Influenza,inj,Quad PF,6+ Mos 09/29/2013, 09/06/2014,  10/24/2015, 09/24/2016, 10/07/2017, 10/01/2018  . Tdap 04/20/2012  . Zoster Recombinat (Shingrix) 12/17/2018, 03/02/2019   4. Cervical cancer screening- normal Pap smear January 2019 with HPV negative- no further Pap smears required as will be over age 54 when next needed 5. Breast cancer screening-  breast exam today- normal- and mammogram 03/02/2019 6. Colon cancer screening - May 2012 with 10-year repeat planned 7. Skin cancer screening-dermatology yearly Dr. Delman Cheadle. advised regular sunscreen use. Denies worrisome, changing, or new skin lesions.  8. Birth control/STD check-postmenopausal/monogamous.  Declines universal HIV screening  9. Osteoporosis screening at 47-  updated bone density with worst t score -2/4 02/2019-technically has osteoporosis based on 1x elevation but wanted to work on weightbearing exercises, calcium, diet, vitamin D. -Never smoker  Status of chronic or acute concerns   #social update- having to provide a lot of care for family members Dad moved from independent living to assisted living Sister massive stroke age 83- now at [facility]- from SNF to assisted liing - patient does occasionally <1 x a week have short lived palpitations in AM- offered cardiac monitor- she declines for now- under 15 second episodes.   Thyroid Disease -history of Graves' disease treated with PTU in the past.  No Rx.  Update TSH today  Hyperlipidemia -mild elevations-update lipids today and calculate 10-year ASCVD risk using home blood pressure numbers  Osteoporosis -see above  Insomnia-sparing amitriptyline for stress/anxiety/sleep- willing to provide refills. Really helps relax her.   Whitecoat hypertension- continues have good control at home. Brings home #s and average over last year 135.6/81.7. BP today was 180/94   Recommended follow up: 1 year physical  Lab/Order associations:fasting    ICD-10-CM   1. Preventative health care  Z00.00 CBC    Comprehensive metabolic panel    Lipid  panel    TSH    VITAMIN D 25 Hydroxy (Vit-D Deficiency, Fractures)  2. Elevated blood pressure reading in office with white coat syndrome, without diagnosis of hypertension  R03.0   3. Osteoporosis, unspecified osteoporosis type, unspecified pathological fracture presence  M81.0 VITAMIN D 25 Hydroxy (Vit-D Deficiency, Fractures)  4. Hyperlipidemia, unspecified hyperlipidemia type  E78.5 CBC    Comprehensive metabolic panel    Lipid panel  5. Thyroid disease  E07.9 TSH   Return precautions advised.  Garret Reddish, MD

## 2019-08-09 ENCOUNTER — Encounter: Payer: Self-pay | Admitting: Family Medicine

## 2019-08-09 ENCOUNTER — Other Ambulatory Visit: Payer: Self-pay

## 2019-08-09 ENCOUNTER — Ambulatory Visit (INDEPENDENT_AMBULATORY_CARE_PROVIDER_SITE_OTHER): Payer: 59 | Admitting: Family Medicine

## 2019-08-09 ENCOUNTER — Telehealth: Payer: Self-pay

## 2019-08-09 VITALS — BP 136/82 | HR 90 | Temp 97.9°F | Ht 62.0 in | Wt 105.6 lb

## 2019-08-09 DIAGNOSIS — E079 Disorder of thyroid, unspecified: Secondary | ICD-10-CM

## 2019-08-09 DIAGNOSIS — R030 Elevated blood-pressure reading, without diagnosis of hypertension: Secondary | ICD-10-CM

## 2019-08-09 DIAGNOSIS — D7589 Other specified diseases of blood and blood-forming organs: Secondary | ICD-10-CM

## 2019-08-09 DIAGNOSIS — M810 Age-related osteoporosis without current pathological fracture: Secondary | ICD-10-CM

## 2019-08-09 DIAGNOSIS — E785 Hyperlipidemia, unspecified: Secondary | ICD-10-CM

## 2019-08-09 DIAGNOSIS — Z0000 Encounter for general adult medical examination without abnormal findings: Secondary | ICD-10-CM

## 2019-08-09 DIAGNOSIS — E673 Hypervitaminosis D: Secondary | ICD-10-CM

## 2019-08-09 LAB — CBC
HCT: 39.9 % (ref 36.0–46.0)
Hemoglobin: 13.3 g/dL (ref 12.0–15.0)
MCHC: 33.2 g/dL (ref 30.0–36.0)
MCV: 100.8 fl — ABNORMAL HIGH (ref 78.0–100.0)
Platelets: 235 10*3/uL (ref 150.0–400.0)
RBC: 3.96 Mil/uL (ref 3.87–5.11)
RDW: 13.4 % (ref 11.5–15.5)
WBC: 6.4 10*3/uL (ref 4.0–10.5)

## 2019-08-09 LAB — COMPREHENSIVE METABOLIC PANEL
ALT: 14 U/L (ref 0–35)
AST: 24 U/L (ref 0–37)
Albumin: 4.7 g/dL (ref 3.5–5.2)
Alkaline Phosphatase: 43 U/L (ref 39–117)
BUN: 15 mg/dL (ref 6–23)
CO2: 29 mEq/L (ref 19–32)
Calcium: 9.5 mg/dL (ref 8.4–10.5)
Chloride: 101 mEq/L (ref 96–112)
Creatinine, Ser: 0.72 mg/dL (ref 0.40–1.20)
GFR: 81.76 mL/min (ref >60.00)
Glucose, Bld: 107 mg/dL — ABNORMAL HIGH (ref 70–99)
Potassium: 4.6 mEq/L (ref 3.5–5.1)
Sodium: 139 mEq/L (ref 135–145)
Total Bilirubin: 0.4 mg/dL (ref 0.2–1.2)
Total Protein: 7.5 g/dL (ref 6.0–8.3)

## 2019-08-09 LAB — LIPID PANEL
Cholesterol: 265 mg/dL — ABNORMAL HIGH (ref 0–200)
HDL: 96.3 mg/dL (ref >39.00)
LDL Cholesterol: 157 mg/dL — ABNORMAL HIGH (ref 0–99)
NonHDL: 168.99
Total CHOL/HDL Ratio: 3
Triglycerides: 62 mg/dL (ref 0.0–149.0)
VLDL: 12.4 mg/dL (ref 0.0–40.0)

## 2019-08-09 LAB — TSH: TSH: 2.33 u[IU]/mL (ref 0.35–4.50)

## 2019-08-09 LAB — VITAMIN D 25 HYDROXY (VIT D DEFICIENCY, FRACTURES): VITD: 103.61 ng/mL (ref 30.00–100.00)

## 2019-08-09 NOTE — Telephone Encounter (Signed)
Noted  

## 2019-08-09 NOTE — Telephone Encounter (Signed)
I am thinking after the 2 to 3 months off of calcium and vitamin D we will have her take just 1000 units of vitamin D per day and she can target a total of 1200 mg of calcium between diet and supplementation.

## 2019-08-09 NOTE — Telephone Encounter (Signed)
Called and spoke with patient. Pt verbalized understanding. Lab visit scheduled, future lab orders placed.   Per pt, she has been taking Calcium + D chews containing 2000 IU of Vit D and 1000 mg of Calcium daily. She ran out of her other Vitamin D supplement, which she thinks was about [phone] IU daily, about 1 month ago.

## 2019-08-09 NOTE — Telephone Encounter (Signed)
Crystal Olp, MD  [facility]        Vitamin D is above recommended limits of 100 at 103 ng/mL today. I want you to stop calcium and vitamin D supplements over the next 3 months.  team please order vitamin D under high vitamin D and help set up labs in 2-3 months-I need to know how much vitamin D she is taking per day total through supplements. I also need to know how much calcium she is taking per day. The reason we hold the calcium as well is that high vitamin D can cause excess calcium levels which can be dangerous-fortunately her calcium levels are normal.

## 2019-08-11 ENCOUNTER — Encounter: Payer: Self-pay | Admitting: Family Medicine

## 2019-08-11 DIAGNOSIS — R739 Hyperglycemia, unspecified: Secondary | ICD-10-CM

## 2019-08-11 DIAGNOSIS — R030 Elevated blood-pressure reading, without diagnosis of hypertension: Secondary | ICD-10-CM

## 2019-08-27 ENCOUNTER — Encounter: Payer: Self-pay | Admitting: Family Medicine

## 2019-09-06 ENCOUNTER — Other Ambulatory Visit: Payer: Self-pay

## 2019-09-07 ENCOUNTER — Ambulatory Visit (INDEPENDENT_AMBULATORY_CARE_PROVIDER_SITE_OTHER): Payer: 59

## 2019-09-07 DIAGNOSIS — Z23 Encounter for immunization: Secondary | ICD-10-CM | POA: Diagnosis not present

## 2019-10-11 ENCOUNTER — Emergency Department (HOSPITAL_COMMUNITY)
Admission: EM | Admit: 2019-10-11 | Discharge: 2019-10-12 | Disposition: A | Discharge status: Home / Self Care | Payer: 59 | Attending: Emergency Medicine | Admitting: Emergency Medicine

## 2019-10-11 ENCOUNTER — Encounter (HOSPITAL_COMMUNITY)
Admission: EM | Disposition: A | Discharge status: Home / Self Care | Payer: Self-pay | Source: Home / Self Care | Attending: Emergency Medicine

## 2019-10-11 ENCOUNTER — Encounter (HOSPITAL_COMMUNITY): Payer: Self-pay | Admitting: Emergency Medicine

## 2019-10-11 ENCOUNTER — Other Ambulatory Visit: Payer: Self-pay

## 2019-10-11 ENCOUNTER — Ambulatory Visit: Payer: Self-pay | Admitting: *Deleted

## 2019-10-11 DIAGNOSIS — X58XXXA Exposure to other specified factors, initial encounter: Secondary | ICD-10-CM | POA: Diagnosis not present

## 2019-10-11 DIAGNOSIS — Z20828 Contact with and (suspected) exposure to other viral communicable diseases: Secondary | ICD-10-CM | POA: Insufficient documentation

## 2019-10-11 DIAGNOSIS — Y9389 Activity, other specified: Secondary | ICD-10-CM | POA: Insufficient documentation

## 2019-10-11 DIAGNOSIS — M810 Age-related osteoporosis without current pathological fracture: Secondary | ICD-10-CM | POA: Insufficient documentation

## 2019-10-11 DIAGNOSIS — Y929 Unspecified place or not applicable: Secondary | ICD-10-CM | POA: Insufficient documentation

## 2019-10-11 DIAGNOSIS — Y999 Unspecified external cause status: Secondary | ICD-10-CM | POA: Diagnosis not present

## 2019-10-11 DIAGNOSIS — K222 Esophageal obstruction: Secondary | ICD-10-CM

## 2019-10-11 DIAGNOSIS — T18120A Food in esophagus causing compression of trachea, initial encounter: Secondary | ICD-10-CM | POA: Insufficient documentation

## 2019-10-11 DIAGNOSIS — Z79899 Other long term (current) drug therapy: Secondary | ICD-10-CM | POA: Insufficient documentation

## 2019-10-11 DIAGNOSIS — T18128A Food in esophagus causing other injury, initial encounter: Secondary | ICD-10-CM

## 2019-10-11 DIAGNOSIS — E079 Disorder of thyroid, unspecified: Secondary | ICD-10-CM | POA: Diagnosis not present

## 2019-10-11 DIAGNOSIS — T189XXA Foreign body of alimentary tract, part unspecified, initial encounter: Secondary | ICD-10-CM | POA: Diagnosis present

## 2019-10-11 HISTORY — PX: ESOPHAGOGASTRODUODENOSCOPY: SHX5428

## 2019-10-11 HISTORY — PX: FOREIGN BODY REMOVAL: SHX962

## 2019-10-11 HISTORY — PX: BIOPSY: SHX5522

## 2019-10-11 HISTORY — PX: ESOPHAGEAL DILATION: SHX303

## 2019-10-11 LAB — CBC WITH DIFFERENTIAL/PLATELET
Abs Immature Granulocytes: 0.02 10*3/uL (ref 0.00–0.07)
Basophils Absolute: 0.1 10*3/uL (ref 0.0–0.1)
Basophils Relative: 1 %
Eosinophils Absolute: 1.6 10*3/uL — ABNORMAL HIGH (ref 0.0–0.5)
Eosinophils Relative: 18 %
HCT: 41.9 % (ref 36.0–46.0)
Hemoglobin: 13.7 g/dL (ref 12.0–15.0)
Immature Granulocytes: 0 %
Lymphocytes Relative: 17 %
Lymphs Abs: 1.4 10*3/uL (ref 0.7–4.0)
MCH: 33 pg (ref 26.0–34.0)
MCHC: 32.7 g/dL (ref 30.0–36.0)
MCV: 101 fL — ABNORMAL HIGH (ref 80.0–100.0)
Monocytes Absolute: 0.9 10*3/uL (ref 0.1–1.0)
Monocytes Relative: 11 %
Neutro Abs: 4.6 10*3/uL (ref 1.7–7.7)
Neutrophils Relative %: 53 %
Platelets: 284 10*3/uL (ref 150–400)
RBC: 4.15 MIL/uL (ref 3.87–5.11)
RDW: 13.5 % (ref 11.5–15.5)
WBC: 8.6 10*3/uL (ref 4.0–10.5)
nRBC: 0 % (ref 0.0–0.2)

## 2019-10-11 LAB — BASIC METABOLIC PANEL
Anion gap: 12 (ref 5–15)
BUN: 23 mg/dL (ref 8–23)
CO2: 26 mmol/L (ref 22–32)
Calcium: 9.7 mg/dL (ref 8.9–10.3)
Chloride: 106 mmol/L (ref 98–111)
Creatinine, Ser: 0.69 mg/dL (ref 0.44–1.00)
GFR calc Af Amer: >60 mL/min (ref >60)
GFR calc non Af Amer: >60 mL/min (ref >60)
Glucose, Bld: 96 mg/dL (ref 70–99)
Potassium: 4.4 mmol/L (ref 3.5–5.1)
Sodium: 144 mmol/L (ref 135–145)

## 2019-10-11 LAB — SARS CORONAVIRUS 2 BY RT PCR (HOSPITAL ORDER, PERFORMED IN ~~LOC~~ HOSPITAL LAB): SARS Coronavirus 2: NEGATIVE

## 2019-10-11 SURGERY — EGD (ESOPHAGOGASTRODUODENOSCOPY)
Anesthesia: Moderate Sedation

## 2019-10-11 MED ORDER — BUTAMBEN-TETRACAINE-BENZOCAINE 2-2-14 % EX AERO
INHALATION_SPRAY | CUTANEOUS | Status: DC | PRN
  Administered 2019-10-11: 22:00:00 1 via TOPICAL

## 2019-10-11 MED ORDER — MIDAZOLAM HCL (PF) 5 MG/ML IJ SOLN
INTRAMUSCULAR | Status: AC
  Filled 2019-10-11: qty 2

## 2019-10-11 MED ORDER — GLUCAGON HCL RDNA (DIAGNOSTIC) 1 MG IJ SOLR
1.0000 mg | Freq: Once | INTRAMUSCULAR | Status: AC
  Administered 2019-10-11: 17:00:00 1 mg via INTRAVENOUS
  Filled 2019-10-11: qty 1

## 2019-10-11 MED ORDER — SODIUM CHLORIDE 0.9 % IV SOLN
INTRAVENOUS | Status: DC
  Administered 2019-10-11: 18:00:00 via INTRAVENOUS

## 2019-10-11 MED ORDER — DIPHENHYDRAMINE HCL 50 MG/ML IJ SOLN
INTRAMUSCULAR | Status: DC | PRN
  Administered 2019-10-11: 25 mg via INTRAVENOUS

## 2019-10-11 MED ORDER — DIPHENHYDRAMINE HCL 50 MG/ML IJ SOLN
INTRAMUSCULAR | Status: AC
  Filled 2019-10-11: qty 1

## 2019-10-11 MED ORDER — GLUCAGON HCL RDNA (DIAGNOSTIC) 1 MG IJ SOLR
1.0000 mg | Freq: Once | INTRAMUSCULAR | Status: AC
  Administered 2019-10-11: 20:00:00 1 mg via INTRAVENOUS
  Filled 2019-10-11: qty 1

## 2019-10-11 MED ORDER — MIDAZOLAM HCL (PF) 10 MG/2ML IJ SOLN
INTRAMUSCULAR | Status: DC | PRN
  Administered 2019-10-11 (×3): 2 mg via INTRAVENOUS

## 2019-10-11 MED ORDER — FENTANYL CITRATE (PF) 100 MCG/2ML IJ SOLN
INTRAMUSCULAR | Status: AC
  Filled 2019-10-11: qty 4

## 2019-10-11 MED ORDER — ESOMEPRAZOLE MAGNESIUM 40 MG PO CPDR: 40.0000 mg | DELAYED_RELEASE_CAPSULE | Freq: Every day | ORAL | 3 refills | Status: DC

## 2019-10-11 MED ORDER — FENTANYL CITRATE (PF) 100 MCG/2ML IJ SOLN
INTRAMUSCULAR | Status: DC | PRN
  Administered 2019-10-11 (×4): 25 ug via INTRAVENOUS

## 2019-10-11 NOTE — Op Note (Signed)
[facility] Patient Name: Crystal [name] Procedure Date: 10/11/2019 MRN: [id number] Attending MD: Gatha Mayer , MD Date of Birth: 1956-07-31 CSN: [id number] Age: 63 Admit Type: Inpatient Procedure:                Upper GI endoscopy Indications:              Dysphagia, Foreign body in the esophagus Providers:                Gatha Mayer, MD, Glori Bickers, RN, Ladona Ridgel, Technician Referring MD:             Brayton Mars. Hunter MD Medicines:                Fentanyl 100 micrograms IV, Midazolam 6 mg IV,                            Diphenhydramine 25 mg IV Complications:            No immediate complications. Estimated Blood Loss:     Estimated blood loss was minimal. Procedure:                Pre-Anesthesia Assessment:                           - Prior to the procedure, a History and Physical                            was performed, and patient medications and                            allergies were reviewed. The patient's tolerance of                            previous anesthesia was also reviewed. The risks                            and benefits of the procedure and the sedation                            options and risks were discussed with the patient.                            All questions were answered, and informed consent                            was obtained. Prior Anticoagulants: The patient has                            taken no previous anticoagulant or antiplatelet                            agents. ASA Grade Assessment: II - A patient with  mild systemic disease. After reviewing the risks                            and benefits, the patient was deemed in                            satisfactory condition to undergo the procedure.                           After obtaining informed consent, the endoscope was                            passed under direct vision. Throughout the                 procedure, the patient's blood pressure, pulse, and                            oxygen saturations were monitored continuously. The                            [id number] Olympus gastroscope was                            introduced through the mouth, and advanced to the                            body of the stomach. The upper GI endoscopy was                            performed with difficulty due to the patient's                            agitation. The upper GI endoscopy was somewhat                            difficult due to the patient's agitation.                            Successful completion of the procedure was aided by                            increasing the dose of sedation medication. The                            patient tolerated the procedure fairly well. Scope In: Scope Out: Findings:      Mucosal changes including ringed esophagus, longitudinal furrows,       small-caliber esophagus, white plaques and mucosal friability were found       in the entire esophagus. Esophageal findings were graded using the       Eosinophilic Esophagitis Endoscopic Reference Score (EoE-EREFS) as:       Edema Grade 1 Present (decreased clarity or absence of vascular       markings), Rings Grade 3 Severe (distinct rings that do not permit  passage of diagnostic 8-10 mm endoscope), Exudates Grade 2 Severe       (scattered white lesions involving 10 percent or greater of the       esophageal surface area), Furrows Grade 1 Present (vertical lines with       or without visible depth) and Stricture present. Biopsies were taken       with a cold forceps for histology. Verification of patient       identification for the specimen was done. Estimated blood loss was       minimal.      Multiple benign-appearing, intrinsic severe stenoses were found at the       cricopharyngeus. The narrowest stenosis measured 7 mm (inner diameter).       The stenoses were traversed  after dilation. The scope was withdrawn.       Dilation was performed with a Maloney dilator with moderate resistance       at 40 Fr. The dilation site was examined following endoscope reinsertion       and showed no change. Estimated blood loss: none. The scope was       withdrawn. Dilation was performed with a Maloney dilator with moderate       resistance at 44 Fr. The dilation site was examined following endoscope       reinsertion and showed moderate improvement in luminal narrowing.       Estimated blood loss was minimal.      One benign-appearing, intrinsic severe stenosis was found in the upper       third of the esophagus. This stenosis measured 8 mm (inner diameter).       The stenosis was traversed after downsizing scope. The scope was       withdrawn and replaced with the pediatric endoscope because of       difficulty passing the scope.      Food was found at the gastroesophageal junction. kidney bean Removal of       food was accomplished. Verification of patient identification for the       specimen was done. Estimated blood loss: none.      No gross lesions were noted in the entire examined stomach.      The cardia and gastric fundus were normal on retroflexion. Impression:               - Esophageal mucosal changes consistent with                            eosinophilic esophagitis. Biopsied.                           - Benign-appearing esophageal stenoses. Dilated.                            Proximal USE tight stricture dilated 40 and then 27                            Fr to allow adult scope to pass but then it would                            not pass an upper esophageal stricture distal to  that so switched to ultraslim gastroscope and the                            previously seen kidney bean passed into stomach                            from GE junction                           - Benign-appearing esophageal stenosis. Multiple                            - Food at the gastroesophageal junction. Removal                            was successful. See above                           - No gross lesions in the stomach. Moderate Sedation:      Moderate (conscious) sedation was administered by the endoscopy nurse       and supervised by the endoscopist. The following parameters were       monitored: oxygen saturation, heart rate, blood pressure, respiratory       rate, EKG, adequacy of pulmonary ventilation, and response to care.       Total physician intraservice time was 15 minutes. Recommendation:           - Patient has a contact number available for                            emergencies. The signs and symptoms of potential                            delayed complications were discussed with the                            patient. Return to normal activities tomorrow.                            Written discharge instructions were provided to the                            patient.                           - Clear liquid diet today. Will call and advance                            tomorrow if better                           - Continue present medications.                           - Await pathology results.                           -  Will call from office to reassess and determine                            next steps                           She will undoubtedly need more dilation but I think                            if EoE confirmed treatment with viscous budesonide                            for a period of time makes sense given very narrow                            caliber esophagus. Also start PPI now Procedure Code(s):        --- Professional ---                           [phone], Esophagoscopy, flexible, transoral; with                            removal of foreign body(s)                           43202, Esophagoscopy, flexible, transoral; with                            biopsy, single or multiple                            43450, 52, Dilation of esophagus, by unguided sound                            or bougie, single or multiple passes Diagnosis Code(s):        --- Professional ---                           K22.8, Other specified diseases of esophagus                           K22.2, Esophageal obstruction                           T18.128A, Food in esophagus causing other injury,                            initial encounter                           R13.10, Dysphagia, unspecified                           T18.108A, Unspecified foreign body in esophagus  causing other injury, initial encounter CPT copyright 2019 American Medical Association. All rights reserved. The codes documented in this report are preliminary and upon coder review may  be revised to meet current compliance requirements. Gatha Mayer, MD 10/11/2019 10:45:30 PM This report has been signed electronically. Number of Addenda: 0

## 2019-10-11 NOTE — ED Provider Notes (Signed)
[place] DEPT Provider Note   CSN: [id number] Arrival date & time: 10/11/19  1434     History   Chief Complaint Chief Complaint  Patient presents with  . Swallowed Foreign Body    HPI Crystal [name] is a 63 y.o. female.     HPI  She has been unable to swallow either liquids or solids since 10 PM last night.  She typically has difficulty swallowing many things and has to chew food well, and drink fluids, to get material down without problems.  She cannot take any pills unless they are very small such as Elavil.  She has had this problem for many years and never been assessed by GI.  She denies recent illnesses including fever, chills, vomiting, shortness of breath, cough, weakness or dizziness.  There are no known modifying factors.  Past Medical History:  Diagnosis Date  . Thyroid disease 1985   grave's dzs---treated with PTU  . White coat hypertension     Patient Active Problem List   Diagnosis Date Noted  . Hyperlipidemia 01/15/2018  . Elevated blood pressure reading in office with white coat syndrome, without diagnosis of hypertension 07/26/2014  . Osteoporosis 07/26/2014  . Thyroid disease     History reviewed. No pertinent surgical history.   OB History   No obstetric history on file.      Home Medications    Prior to Admission medications   Medication Sig Start Date End Date Taking? Authorizing Provider  amitriptyline (ELAVIL) 25 MG tablet Take 1 tablet (25 mg total) by mouth at bedtime. Take 1 or 2 tablet by mouth at bedtime 01/14/19  Yes Marin Olp, MD    Family History Family History  Problem Relation Age of Onset  . Dementia Mother   . Heart disease Mother 33       sudden cardiac---?? heart block  . Heart disease Father        CABG x2, apparently 17rd open heart surgery.   Marland Kitchen Heart attack Father   . Stroke Sister        age 65. healthy prior to this- [address] [place]  . Hypertension Sister   . Atrial  fibrillation Sister     Social History Social History   Tobacco Use  . Smoking status: Never Smoker  . Smokeless tobacco: Never Used  Substance Use Topics  . Alcohol use: Yes    Alcohol/week: 14.0 standard drinks    Types: 14 Glasses of wine per week  . Drug use: No     Allergies   E-mycin [erythromycin base] and Other   Review of Systems Review of Systems  All other systems reviewed and are negative.    Physical Exam Updated Vital Signs BP (!) 145/82 (BP Location: Right Arm)   Pulse 67   Temp 99.4 F (37.4 C) (Oral)   Resp 18   SpO2 100%   Physical Exam Vitals signs and nursing note reviewed.  Constitutional:      General: She is not in acute distress.    Appearance: Normal appearance. She is well-developed. She is not ill-appearing, toxic-appearing or diaphoretic.  HENT:     Head: Normocephalic and atraumatic.     Right Ear: External ear normal.     Left Ear: External ear normal.  Eyes:     Conjunctiva/sclera: Conjunctivae normal.     Pupils: Pupils are equal, round, and reactive to light.  Neck:     Musculoskeletal: Normal range of motion and neck  supple.     Trachea: Phonation normal.  Cardiovascular:     Rate and Rhythm: Normal rate.  Pulmonary:     Effort: Pulmonary effort is normal.  Abdominal:     Comments: She is continuously spitting clear saliva, during the exam.  Musculoskeletal: Normal range of motion.  Skin:    General: Skin is warm and dry.  Neurological:     Mental Status: She is alert and oriented to person, place, and time.     Cranial Nerves: No cranial nerve deficit.     Sensory: No sensory deficit.     Motor: No abnormal muscle tone.     Coordination: Coordination normal.  Psychiatric:        Mood and Affect: Mood normal.        Behavior: Behavior normal.        Thought Content: Thought content normal.        Judgment: Judgment normal.      ED Treatments / Results  Labs (all labs ordered are listed, but only abnormal  results are displayed) Labs Reviewed  CBC WITH DIFFERENTIAL/PLATELET - Abnormal; Notable for the following components:      Result Value   MCV 101.0 (*)    Eosinophils Absolute 1.6 (*)    All other components within normal limits  SARS CORONAVIRUS 2 BY RT PCR (HOSPITAL ORDER, [facility])  BASIC METABOLIC PANEL    EKG None  Radiology No results found.  Procedures .Critical Care Performed by: Daleen Bo, MD Authorized by: Daleen Bo, MD   Critical care provider statement:    Critical care time (minutes):  35   Critical care start time:  10/11/2019 5:15 PM   Critical care end time:  10/11/2019 9:37 PM   Critical care time was exclusive of:  Separately billable procedures and treating other patients   Critical care was necessary to treat or prevent imminent or life-threatening deterioration of the following conditions: Esophageal obstruction.   Critical care was time spent personally by me on the following activities:  Blood draw for specimens, development of treatment plan with patient or surrogate, discussions with consultants, evaluation of patient's response to treatment, examination of patient, obtaining history from patient or surrogate, ordering and performing treatments and interventions, ordering and review of laboratory studies, pulse oximetry, re-evaluation of patient's condition, review of old charts and ordering and review of radiographic studies   (including critical care time)  Medications Ordered in ED Medications  0.9 %  sodium chloride infusion ( Intravenous Stopped 10/11/19 1927)  glucagon (human recombinant) (GLUCAGEN) injection 1 mg (1 mg Intravenous Given 10/11/19 1723)  glucagon (human recombinant) (GLUCAGEN) injection 1 mg (1 mg Intravenous Given 10/11/19 1939)     Initial Impression / Assessment and Plan / ED Course  I have reviewed the triage vital signs and the nursing notes.  Pertinent labs & imaging results that  were available during my care of the patient were reviewed by me and considered in my medical decision making (see chart for details).  Clinical Course as of Oct 10 2137  Mon Oct 11, 2019  1809 After initial dose of glucagon she is still spitting clear saliva.  Initial GI consult, asked that I call the specialist on-call for her PCP.   [EW]    Clinical Course User Index [EW] Daleen Bo, MD        Patient Vitals for the past 24 hrs:  BP Temp Temp src Pulse Resp SpO2  10/11/19 2036 Marland Kitchen)  145/82 - - 67 18 100 %  10/11/19 1915 (!) 153/91 - - 83 - 100 %  10/11/19 1830 (!) 161/86 - - 71 18 100 %  10/11/19 1745 (!) 159/82 - - 65 - 100 %  10/11/19 1700 (!) 146/82 - - 68 - 100 %  10/11/19 1658 (!) 154/95 - - 66 18 99 %  10/11/19 1447 (!) 165/102 99.4 F (37.4 C) Oral 88 16 100 %    9:34 PM Reevaluation with update and discussion. After initial assessment and treatment, an updated evaluation reveals she reports in the last 90 minutes she has had only minimal saliva regardless.  However she did vomit twice.  She states she saw "a little bit of hamburger in it."  GI has scheduled her for endoscopy at 10 PM, this evening.  Findings discussed and questions answered. Daleen Bo   Medical Decision Making: Ongoing swallowing difficulty, worsening last night, persisting, somewhat improved with glucagon treatment.  Suspect chronic esophageal stricture, complicated by food bolus, which has partially improved.  Patient will benefit from endoscopic evaluation and possible removal of additional food bolus.  Should be done at [facility] discretion after evaluating her.  Crystal [name] was evaluated in Emergency Department on 10/11/2019 for the symptoms described in the history of present illness. She was evaluated in the context of the global COVID-19 pandemic, which necessitated consideration that the patient might be at risk for infection with the SARS-CoV-2 virus that causes COVID-19. Institutional protocols  and algorithms that pertain to the evaluation of patients at risk for COVID-19 are in a state of rapid change based on information released by regulatory bodies including the CDC and federal and state organizations. These policies and algorithms were followed during the patient's care in the ED.   CRITICAL CARE-yes Performed by: Daleen Bo  Nursing Notes Reviewed/ Care Coordinated Applicable Imaging Reviewed Interpretation of Laboratory Data incorporated into ED treatment  Plan-evaluation by GI for treatment and disposition.  Final Clinical Impressions(s) / ED Diagnoses   Final diagnoses:  Esophageal obstruction due to food impaction    ED Discharge Orders    None       Daleen Bo, MD 10/11/19 2138

## 2019-10-11 NOTE — Progress Notes (Signed)
Exam was done at time of h andP

## 2019-10-11 NOTE — H&P (Signed)
[facility] History and Physical   Primary Care Physician:  Marin Olp, MD   Reason for Procedure:   Esophageal obstruction, food impaction  Plan:    Esophagoscopy, remove food impaction     HPI: Crystal [name] is a 63 y.o. female with inability to swallow secretions since yesterday evening, after eating chili. Hx of similar but self-limited epsiode over the yrs w/o heartburn hx.   Past Medical History:  Diagnosis Date  . Thyroid disease 1985   grave's dzs---treated with PTU  . White coat hypertension     History reviewed. No pertinent surgical history.  Prior to Admission medications   Medication Sig Start Date End Date Taking? Authorizing Provider  amitriptyline (ELAVIL) 25 MG tablet Take 1 tablet (25 mg total) by mouth at bedtime. Take 1 or 2 tablet by mouth at bedtime 01/14/19  Yes Marin Olp, MD    Current Facility-Administered Medications  Medication Dose Route Frequency Provider Last Rate Last Dose  . 0.9 %  sodium chloride infusion   Intravenous Continuous Daleen Bo, MD   Stopped at 10/11/19 1927   Current Outpatient Medications  Medication Sig Dispense Refill  . amitriptyline (ELAVIL) 25 MG tablet Take 1 tablet (25 mg total) by mouth at bedtime. Take 1 or 2 tablet by mouth at bedtime 180 tablet 3    Allergies as of 10/11/2019 - Review Complete 10/11/2019  Allergen Reaction Noted  . E-mycin [erythromycin base] Swelling 04/19/2011  . Other Swelling 04/19/2011    Family History  Problem Relation Age of Onset  . Dementia Mother   . Heart disease Mother 49       sudden cardiac---?? heart block  . Heart disease Father        CABG x2, apparently 31rd open heart surgery.   Marland Kitchen Heart attack Father   . Stroke Sister        age 58. healthy prior to this- [address]  . Hypertension Sister   . Atrial fibrillation Sister     Social History   Social History Narrative   Married for 26 years. No kids. 4 cats (lost one 06/2014, another  in 2016). 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 Crystal [name] is starting to wind down late 2018      Socializes way too little reportedly   Loves to exercise, [place] [employer] [place] boating, has place in [place]. Thinking about retirement by age 56.   Eats healthy-kale, spinach beans     Review of Systems:  All other review of systems negative except as mentioned in the HPI.  Physical Exam: Vital signs in last 24 hours: Temp:  [99.4 F (37.4 C)] 99.4 F (37.4 C) (10/19 1447) Pulse Rate:  [62-88] 62 (10/19 2045) Resp:  [16-18] 18 (10/19 2036) BP: (145-165)/(78-102) 149/78 (10/19 2045) SpO2:  [99 %-100 %] 99 % (10/19 2045)   General:   Alert,  Well-developed, well-nourished, pleasant and cooperative in NAD Lungs:  Clear throughout to auscultation.   Heart:  Regular rate and rhythm; no murmurs, clicks, rubs,  or gallops. Abdomen:  Soft, nontender and nondistended. Normal bowel sounds.   Neuro/Psych:  Alert and cooperative. Normal mood and affect. A and O x 3   @Adlynn Lowenstein  Simonne Maffucci, MD, [facility]  [phone] (pager) 10/11/2019 9:57 PM@

## 2019-10-11 NOTE — ED Triage Notes (Signed)
Patient report difficulty keeping food down since eating chili last night. Unable to maintain salvia in triage. Denies SOB. Speaking in full sentences.

## 2019-10-11 NOTE — Telephone Encounter (Signed)
See note

## 2019-10-11 NOTE — Telephone Encounter (Signed)
Noted thanks-agree with advice

## 2019-10-11 NOTE — ED Notes (Signed)
Pt said the last time she ate or drank was 10/10/19 at 2200.

## 2019-10-11 NOTE — ED Provider Notes (Signed)
Assessment after return from the endoscopy suite.  I talked to her endoscopist, Dr. Carlean Purl, who stated that he found eosinophilic esophagitis, and that she can be treated as an outpatient.  Vitals with BMI 10/11/2019 10/11/2019 10/11/2019  Height - - -  Weight - - -  BMI - - -  Systolic [id number]  Diastolic 78 75 74  Pulse 83 83 83    At this time she is alert, calm and comfortable.  Findings discussed with patient and her husband who is at the bedside.  Dr. Carlean Purl sent a prescription to her pharmacy and filled out the discharge instructions.  Patient stable for discharge with outpatient management.   Daleen Bo, MD 10/12/19 0001

## 2019-10-11 NOTE — Telephone Encounter (Signed)
Pt called in c/o having difficulty swallow even water.   She also has a lot of oral secretions.    This started last night when she got choked on some chili.  I have referred her to the ED.  Her husband is going to take her.  I sent these notes to Dr. Ansel Bong office.    Reason for Disposition . SEVERE difficulty swallowing (e.g., drooling or spitting, can't swallow water)  Answer Assessment - Initial Assessment Questions 1. SYMPTOM: "Are you having difficulty swallowing liquids, solids, or both?"     Last night it started.  I was eating chili and I got choked.   It goes away in an hour or so.   I feel like it's stuck even when I try to drink water.    I have a lot of oral secretions. 2. ONSET: "When did the swallowing problems begin?"      Last night 3. CAUSE: "What do you think is causing the problem?"      It feels like something is stuck in my throat.  No trouble breathing.   I don't know what is causing this. 4. CHRONIC/RECURRENT: "Is this a new problem for you?"  If no, ask: "How long have you had this problem?" (e.g., days, weeks, months)      This happened several years ago.   5. OTHER SYMPTOMS: "Do you have any other symptoms?" (e.g., difficulty breathing, sore throat, swollen tongue, chest pain)     No trouble  breathing. 6. PREGNANCY: "Is there any chance you are pregnant?" "When was your last menstrual period?"     N/A due to age  Protocols used: SWALLOWING DIFFICULTY-A-AH

## 2019-10-11 NOTE — Discharge Instructions (Addendum)
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 The esophagus is severely narrowed and I had to dilate the top part to get the scope in but then had to change to a very small scope also. There was a kidney bean lodged and it dropped into the stomach.  This is a treatable condition with acid-blocing medication and some others which will be explained. You will probably also need more dilation.   In eosinophilic esophagitis (e-o-sin-o-FILL-ik uh-sof-uh-JIE-tis), a type of white blood cell (eosinophil) builds up in the lining of the tube that connects your mouth to your stomach (esophagus). This buildup, which is a reaction to foods, allergens or acid reflux, can inflame or injure the esophageal tissue. Damaged esophageal tissue can lead to difficulty swallowing or cause food to get caught when you swallow.  Eosinophilic esophagitis is a chronic immune system disease. It has been identified only in the past two decades, but is now considered a major cause of digestive system (gastrointestinal) illness. Research is ongoing and will likely lead to revisions in its diagnosis and treatment.   YOU HAD AN ENDOSCOPIC PROCEDURE TODAY: Refer to the procedure report and other information in the discharge instructions given to you for any specific questions about what was found during the examination. If this information does not answer your questions, please call Dr. Celesta Aver office at [phone] to clarify.   YOU SHOULD EXPECT: Some feelings of bloating in the abdomen. Passage of more gas than usual. Walking can help get rid of the air that was put into your GI tract during the procedure and reduce the bloating. If you had a lower endoscopy (such as a colonoscopy or flexible sigmoidoscopy) you may notice spotting of blood in your stool or on the toilet paper. Some abdominal soreness may be present for a day or two, also.  DIET:   STAY ON CLEAR  LIQUIDS TONIGHT AND OTHER LIQUIDS (NOT CLEAR) MAY BE ADDED TOMORROW)  WE WILL CALL WITH FURTHER PLANS  ACTIVITY: Your care partner should take you home directly after the procedure. You should plan to take it easy, moving slowly for the rest of the day. You can resume normal activity the day after the procedure however YOU SHOULD NOT DRIVE, use power tools, machinery or perform tasks that involve climbing or major physical exertion for 24 hours (because of the sedation medicines used during the test).   SYMPTOMS TO REPORT IMMEDIATELY: A gastroenterologist can be reached at any hour. Please call [phone]  for any of the following symptoms:  Following upper endoscopy (EGD, EUS, ERCP, esophageal dilation) Vomiting of blood or coffee ground material  New, significant abdominal pain  New, significant chest pain or pain under the shoulder blades  Painful or persistently difficult swallowing  New shortness of breath  Black, tarry-looking or red, bloody stools  It is not unusual to have some painful swallowing after the dilation but any severe pain warrants a call.

## 2019-10-11 NOTE — Telephone Encounter (Signed)
FYI

## 2019-10-12 ENCOUNTER — Encounter (HOSPITAL_COMMUNITY): Payer: Self-pay | Admitting: Internal Medicine

## 2019-10-13 ENCOUNTER — Encounter: Payer: Self-pay | Admitting: Family Medicine

## 2019-10-14 LAB — SURGICAL PATHOLOGY

## 2019-10-18 ENCOUNTER — Other Ambulatory Visit: Payer: 59

## 2019-10-18 ENCOUNTER — Other Ambulatory Visit: Payer: Self-pay

## 2019-10-18 ENCOUNTER — Telehealth: Payer: Self-pay

## 2019-10-18 DIAGNOSIS — R1310 Dysphagia, unspecified: Secondary | ICD-10-CM

## 2019-10-18 MED ORDER — ESOMEPRAZOLE MAGNESIUM 40 MG PO CPDR: 40.0000 mg | DELAYED_RELEASE_CAPSULE | Freq: Two times a day (BID) | ORAL | 3 refills | Status: DC

## 2019-10-18 NOTE — Telephone Encounter (Signed)
-----   Message from Irene Shipper, MD sent at 10/18/2019 12:21 PM EDT ----- Regarding: RE: Patient needs f/u - after food impaction and suspected EoE Reviewed.  Thanks Glendell Docker. Linda,Please contact the patient with the following recommendations/plan.1.  Chew food extremely well.  Avoid meats to avoid interval impaction.  Soft foods and liquids would be best.2.  Increase her current PPI to twice daily.  Adjust prescription if needed3.  Schedule EGD with Savary dilation at the hospital Monday, November 23.  I have a hospital block in the morning.  Set her up to follow my other cases.  I WILL NEED FLUOROSCOPY.Cy Blamer ----- Message ----- From: Gatha Mayer, MD Sent: 10/18/2019  10:36 AM EDT To: Irene Shipper, MD, Algernon Huxley, RN Subject: Patient needs f/u - after food impaction and#  Jenny Reichmann and Vaughan Basta,  I removed a food impaction from this lady 10/19 and took biopsies - pathology was not routed to me - I found it today and it did not show eosinophilic esophagitis but still suspect that   I had to dilate some to even get to the kidney bean that was impacting esophagus   She needs a phone call to reassess sxs and also arrange f/u with Dr. Scarlette Shorts - I was thinking a barium swallow no tablet would be next step perhaps though repeating an EGd with fluoro (very small caliber esophagus) and more bxs, dilation   I was thinking of starting oral budesonide but without the path confirming EoE have not  I did start PPI  Thanks  I kept waiting for path to come back - sorry for delay  Glendell Docker

## 2019-10-18 NOTE — Telephone Encounter (Signed)
Pt aware. Scheduled for covid testing at [place] 11/11/19@8 :30am, EGD with savary dilation and fluroscopy scheduled at [facility] 11/15/19@11 :30am. Pt to arrive at [facility] at [place]. Pt aware of appts, ambulatory referral in epic.

## 2019-10-21 ENCOUNTER — Telehealth: Payer: Self-pay | Admitting: Internal Medicine

## 2019-10-21 NOTE — Telephone Encounter (Signed)
Discussed with pt that she can take liquid otc tylenol or ibuprofen for headache and to take it as the directions state.

## 2019-11-10 ENCOUNTER — Telehealth: Payer: Self-pay | Admitting: Internal Medicine

## 2019-11-10 NOTE — Telephone Encounter (Signed)
EGD instructions sent via my chart.

## 2019-11-11 ENCOUNTER — Encounter (HOSPITAL_COMMUNITY): Payer: Self-pay | Admitting: Emergency Medicine

## 2019-11-11 ENCOUNTER — Other Ambulatory Visit (HOSPITAL_COMMUNITY)
Admission: RE | Admit: 2019-11-11 | Discharge: 2019-11-11 | Disposition: A | Discharge status: Home / Self Care | Payer: 59 | Source: Ambulatory Visit | Attending: Internal Medicine | Admitting: Internal Medicine

## 2019-11-11 ENCOUNTER — Other Ambulatory Visit: Payer: Self-pay

## 2019-11-11 DIAGNOSIS — Z20828 Contact with and (suspected) exposure to other viral communicable diseases: Secondary | ICD-10-CM | POA: Diagnosis not present

## 2019-11-11 DIAGNOSIS — Z01812 Encounter for preprocedural laboratory examination: Secondary | ICD-10-CM | POA: Insufficient documentation

## 2019-11-11 NOTE — Progress Notes (Signed)
Pre-op endo call attempted. No answer. lmtcb 

## 2019-11-11 NOTE — Progress Notes (Signed)
Pre-op endo call completed. Patient would like endo care team to know that after last EGD 09-2019 , her throat was sore and still continues to be sore , worse when she drinks hot liquids. Also she report she had a headache for 10 days  After the last EGD . She asks this be noted so she does not forget to discuss this with Dr Henrene Pastor day of procedure

## 2019-11-12 LAB — NOVEL CORONAVIRUS, NAA (HOSP ORDER, SEND-OUT TO REF LAB; TAT 18-24 HRS): SARS-CoV-2, NAA: NOT DETECTED

## 2019-11-12 NOTE — Progress Notes (Signed)
Pre op call done for procedure on Monday 11/23. Patient states they will remain in quarantine over the weekend. All questions addressed.

## 2019-11-15 ENCOUNTER — Encounter (HOSPITAL_COMMUNITY): Payer: Self-pay | Admitting: Emergency Medicine

## 2019-11-15 ENCOUNTER — Ambulatory Visit (HOSPITAL_COMMUNITY): Payer: 59 | Admitting: Certified Registered"

## 2019-11-15 ENCOUNTER — Ambulatory Visit (HOSPITAL_COMMUNITY): Payer: 59

## 2019-11-15 ENCOUNTER — Other Ambulatory Visit: Payer: Self-pay

## 2019-11-15 ENCOUNTER — Encounter (HOSPITAL_COMMUNITY)
Admission: RE | Disposition: A | Discharge status: Home / Self Care | Payer: Self-pay | Source: Home / Self Care | Attending: Internal Medicine

## 2019-11-15 ENCOUNTER — Ambulatory Visit (HOSPITAL_COMMUNITY)
Admission: RE | Admit: 2019-11-15 | Discharge: 2019-11-15 | Disposition: A | Discharge status: Home / Self Care | Payer: 59 | Attending: Internal Medicine | Admitting: Internal Medicine

## 2019-11-15 DIAGNOSIS — M810 Age-related osteoporosis without current pathological fracture: Secondary | ICD-10-CM | POA: Diagnosis not present

## 2019-11-15 DIAGNOSIS — K222 Esophageal obstruction: Secondary | ICD-10-CM | POA: Diagnosis present

## 2019-11-15 DIAGNOSIS — Z79899 Other long term (current) drug therapy: Secondary | ICD-10-CM | POA: Diagnosis not present

## 2019-11-15 DIAGNOSIS — Z881 Allergy status to other antibiotic agents status: Secondary | ICD-10-CM | POA: Insufficient documentation

## 2019-11-15 DIAGNOSIS — E039 Hypothyroidism, unspecified: Secondary | ICD-10-CM | POA: Insufficient documentation

## 2019-11-15 DIAGNOSIS — Z8249 Family history of ischemic heart disease and other diseases of the circulatory system: Secondary | ICD-10-CM | POA: Insufficient documentation

## 2019-11-15 DIAGNOSIS — I10 Essential (primary) hypertension: Secondary | ICD-10-CM | POA: Insufficient documentation

## 2019-11-15 DIAGNOSIS — R1310 Dysphagia, unspecified: Secondary | ICD-10-CM | POA: Diagnosis not present

## 2019-11-15 DIAGNOSIS — Z888 Allergy status to other drugs, medicaments and biological substances status: Secondary | ICD-10-CM | POA: Insufficient documentation

## 2019-11-15 DIAGNOSIS — K219 Gastro-esophageal reflux disease without esophagitis: Secondary | ICD-10-CM | POA: Insufficient documentation

## 2019-11-15 DIAGNOSIS — E785 Hyperlipidemia, unspecified: Secondary | ICD-10-CM | POA: Insufficient documentation

## 2019-11-15 HISTORY — PX: ESOPHAGOGASTRODUODENOSCOPY (EGD) WITH PROPOFOL: SHX5813

## 2019-11-15 HISTORY — PX: SAVORY DILATION: SHX5439

## 2019-11-15 SURGERY — ESOPHAGOGASTRODUODENOSCOPY (EGD) WITH PROPOFOL
Anesthesia: Monitor Anesthesia Care

## 2019-11-15 MED ORDER — PROPOFOL 10 MG/ML IV BOLUS
INTRAVENOUS | Status: DC | PRN
  Administered 2019-11-15: 30 mg via INTRAVENOUS
  Administered 2019-11-15 (×2): 20 mg via INTRAVENOUS
  Administered 2019-11-15: 10 mg via INTRAVENOUS
  Administered 2019-11-15: 20 mg via INTRAVENOUS
  Administered 2019-11-15: 10 mg via INTRAVENOUS
  Administered 2019-11-15: 20 mg via INTRAVENOUS

## 2019-11-15 MED ORDER — PROPOFOL 10 MG/ML IV BOLUS
INTRAVENOUS | Status: AC
  Filled 2019-11-15: qty 20

## 2019-11-15 MED ORDER — LACTATED RINGERS IV SOLN
INTRAVENOUS | Status: AC | PRN
  Administered 2019-11-15: 1000 mL via INTRAVENOUS

## 2019-11-15 MED ORDER — PROPOFOL 500 MG/50ML IV EMUL
INTRAVENOUS | Status: DC | PRN
  Administered 2019-11-15: 125 ug/kg/min via INTRAVENOUS

## 2019-11-15 MED ORDER — SODIUM CHLORIDE 0.9 % IV SOLN: INTRAVENOUS | Status: DC

## 2019-11-15 MED ORDER — PROPOFOL 500 MG/50ML IV EMUL
INTRAVENOUS | Status: AC
  Filled 2019-11-15: qty 50

## 2019-11-15 SURGICAL SUPPLY — 15 items

## 2019-11-15 NOTE — Transfer of Care (Signed)
Immediate Anesthesia Transfer of Care Note  Patient: Crystal [name]  Procedure(s) Performed: ESOPHAGOGASTRODUODENOSCOPY (EGD) WITH PROPOFOL With savary dilation, need fluroscopy (N/A ) SAVORY DILATION (N/A )  Patient Location: PACU  Anesthesia Type:MAC  Level of Consciousness: awake, alert  and oriented  Airway & Oxygen Therapy: Patient Spontanous Breathing and Patient connected to face mask oxygen  Post-op Assessment: Report given to RN, Post -op Vital signs reviewed and stable and Patient moving all extremities X 4  Post vital signs: Reviewed and stable  Last Vitals:  Vitals Value Taken Time  BP 110/69 11/15/19 1041  Temp    Pulse 72 11/15/19 1043  Resp 15 11/15/19 1043  SpO2 100 % 11/15/19 1043  Vitals shown include unvalidated device data.  Last Pain:  Vitals:   11/15/19 0948  TempSrc: Oral  PainSc: 0-No pain         Complications: No apparent anesthesia complications

## 2019-11-15 NOTE — H&P (Signed)
HISTORY OF PRESENT ILLNESS:  Crystal [name] is a 63 y.o. female known to me previously for colonoscopy who presented to the emergency room with acute food impaction for which she underwent upper endoscopy with Dr. Carlean Purl October 11, 2019.  Found to have very narrow esophagus.  Has been on restricted diet and PPI therapy since.  Now for upper endoscopy with esophageal dilation.  REVIEW OF SYSTEMS:  All non-GI ROS negative except for  Past Medical History:  Diagnosis Date  . Thyroid disease 1985   grave's dzs---treated with PTU  . White coat hypertension     Past Surgical History:  Procedure Laterality Date  . BIOPSY  10/11/2019   Procedure: BIOPSY;  Surgeon: Gatha Mayer, MD;  Location: [facility];  Service: Endoscopy;;  . COLONOSCOPY    . ESOPHAGEAL DILATION  10/11/2019   Procedure: ESOPHAGEAL DILATION;  Surgeon: Gatha Mayer, MD;  Location: [facility]OSCOPY;  Service: Endoscopy;;  . ESOPHAGOGASTRODUODENOSCOPY N/A 10/11/2019   Procedure: ESOPHAGOGASTRODUODENOSCOPY (EGD);  Surgeon: Gatha Mayer, MD;  Location: [facility];  Service: Endoscopy;  Laterality: N/A;  . FOREIGN BODY REMOVAL  10/11/2019   Procedure: FOREIGN BODY REMOVAL;  Surgeon: Gatha Mayer, MD;  Location: [facility];  Service: Endoscopy;;    Social History Dot Been  reports that she has never smoked. She has never used smokeless tobacco. She reports current alcohol use of about 14.0 standard drinks of alcohol per week. She reports that she does not use drugs.  family history includes Atrial fibrillation in her sister; Dementia in her mother; Heart attack in her father; Heart disease in her father; Heart disease (age of onset: 57) in her mother; Hypertension in her sister; Stroke in her sister.  Allergies  Allergen Reactions  . E-Mycin [Erythromycin Base] Swelling  . Other Swelling    Adhesive tape, bandaids       PHYSICAL EXAMINATION: Vital signs: BP (!) 154/86   Pulse 74   Temp 98.2 F  (36.8 C) (Oral)   Resp 17   Wt 44 kg   SpO2 100%   BMI 17.74 kg/m   Constitutional: Thin, generally well-appearing, no acute distress Psychiatric: alert and oriented x3, cooperative Eyes: extraocular movements intact, anicteric, conjunctiva pink Mouth: oral pharynx moist, no lesions Neck: supple no lymphadenopathy Cardiovascular: heart regular rate and rhythm, no murmur Lungs: clear to auscultation bilaterally Abdomen: soft, nontender, nondistended, no obvious ascites, no peritoneal signs, normal bowel sounds, no organomegaly Rectal: Omitted Extremities: no lower extremity edema bilaterally Skin: no lesions on visible extremities Neuro: No focal deficits.  Cranial nerves intact  ASSESSMENT:  1.  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   PLAN:  1.  Upper endoscopy with esophageal dilation.  May need several sessions.The nature of the procedure, as well as the risks, benefits, and alternatives were carefully and thoroughly reviewed with the patient. Ample time for discussion and questions allowed. The patient understood, was satisfied, and agreed to proceed.

## 2019-11-15 NOTE — Discharge Instructions (Signed)
YOU HAD AN ENDOSCOPIC PROCEDURE TODAY: Refer to the procedure report and other information in the discharge instructions given to you for any specific questions about what was found during the examination. If this information does not answer your questions, please call [place] office at [phone] to clarify.   YOU SHOULD EXPECT: Some feelings of bloating in the abdomen. Passage of more gas than usual. Walking can help get rid of the air that was put into your GI tract during the procedure and reduce the bloating. If you had a lower endoscopy (such as a colonoscopy or flexible sigmoidoscopy) you may notice spotting of blood in your stool or on the toilet paper. Some abdominal soreness may be present for a day or two, also.  DIET: Your first meal following the procedure should be a light meal and then it is ok to progress to your normal diet. A half-sandwich or bowl of soup is an example of a good first meal. Heavy or fried foods are harder to digest and may make you feel nauseous or bloated. Drink plenty of fluids but you should avoid alcoholic beverages for 24 hours. If you had a esophageal dilation, please see attached instructions for diet.    ACTIVITY: Your care partner should take you home directly after the procedure. You should plan to take it easy, moving slowly for the rest of the day. You can resume normal activity the day after the procedure however YOU SHOULD NOT DRIVE, use power tools, machinery or perform tasks that involve climbing or major physical exertion for 24 hours (because of the sedation medicines used during the test).   SYMPTOMS TO REPORT IMMEDIATELY: A gastroenterologist can be reached at any hour. Please call [phone]  for any of the following symptoms:   Following upper endoscopy (EGD, EUS, ERCP, esophageal dilation) Vomiting of blood or coffee ground material  New, significant abdominal pain  New, significant chest pain or pain under the shoulder blades  Painful or  persistently difficult swallowing  New shortness of breath  Black, tarry-looking or red, bloody stools  FOLLOW UP:  If any biopsies were taken you will be contacted by phone or by letter within the next 1-3 weeks. Call [phone]  if you have not heard about the biopsies in 3 weeks.  Please also call with any specific questions about appointments or follow up tests.  

## 2019-11-15 NOTE — Anesthesia Postprocedure Evaluation (Signed)
Anesthesia Post Note  Patient: Crystal [name]  Procedure(s) Performed: ESOPHAGOGASTRODUODENOSCOPY (EGD) WITH PROPOFOL With savary dilation, need fluroscopy (N/A ) SAVORY DILATION (N/A )     Patient location during evaluation: PACU Anesthesia Type: MAC Level of consciousness: awake and alert Pain management: pain level controlled Vital Signs Assessment: post-procedure vital signs reviewed and stable Respiratory status: spontaneous breathing, nonlabored ventilation, respiratory function stable and patient connected to nasal cannula oxygen Cardiovascular status: stable and blood pressure returned to baseline Postop Assessment: no apparent nausea or vomiting Anesthetic complications: no    Last Vitals:  Vitals:   11/15/19 1050 11/15/19 1100  BP: 122/72 (!) 155/81  Pulse: 62 67  Resp: 17 15  Temp:    SpO2: 100% 100%    Last Pain:  Vitals:   11/15/19 1041  TempSrc: Oral  PainSc: 0-No pain                 Naome Brigandi

## 2019-11-15 NOTE — Anesthesia Procedure Notes (Signed)
Procedure Name: MAC Date/Time: 11/15/2019 10:13 AM Performed by: Niel Hummer, CRNA Pre-anesthesia Checklist: Patient identified, Emergency Drugs available, Suction available and Patient being monitored Patient Re-evaluated:Patient Re-evaluated prior to induction Oxygen Delivery Method: Simple face mask

## 2019-11-15 NOTE — Anesthesia Preprocedure Evaluation (Addendum)
Anesthesia Evaluation  Patient identified by MRN, date of birth, ID band Patient awake    Reviewed: Allergy & Precautions, H&P , NPO status , Patient's Chart, lab work & pertinent test results, reviewed documented beta blocker date and time   Airway Mallampati: II  TM Distance: >3 FB Neck ROM: full    Dental no notable dental hx. (+) Teeth Intact, Dental Advisory Given   Pulmonary neg pulmonary ROS,    Pulmonary exam normal breath sounds clear to auscultation       Cardiovascular Exercise Tolerance: Good hypertension,  Rhythm:regular Rate:Normal     Neuro/Psych negative neurological ROS  negative psych ROS   GI/Hepatic Neg liver ROS, GERD  Medicated,  Endo/Other  Hypothyroidism   Renal/GU negative Renal ROS  negative genitourinary   Musculoskeletal   Abdominal   Peds  Hematology negative hematology ROS (+)   Anesthesia Other Findings   Reproductive/Obstetrics negative OB ROS                            Anesthesia Physical Anesthesia Plan  ASA: II  Anesthesia Plan: MAC   Post-op Pain Management:    Induction: Intravenous  PONV Risk Score and Plan: 2  Airway Management Planned: Mask and Natural Airway  Additional Equipment:   Intra-op Plan:   Post-operative Plan:   Informed Consent: I have reviewed the patients History and Physical, chart, labs and discussed the procedure including the risks, benefits and alternatives for the proposed anesthesia with the patient or authorized representative who has indicated his/her understanding and acceptance.     Dental Advisory Given  Plan Discussed with: CRNA, Anesthesiologist and Surgeon  Anesthesia Plan Comments:        Anesthesia Quick Evaluation

## 2019-11-15 NOTE — Op Note (Signed)
[facility] Patient Name: Crystal [name] Procedure Date: 11/15/2019 MRN: [id number] Attending MD: Docia Chuck. Henrene Pastor , MD Date of Birth: 1956-02-19 CSN: [id number] Age: 63 Admit Type: Outpatient Procedure:                Upper GI endoscopy Indications:              Therapeutic procedure, Dysphagia Providers:                Docia Chuck. Henrene Pastor, MD, Grace Isaac, RN, Benay Pillow,                            RN, William Dalton, Technician Referring MD:              Medicines:                Monitored Anesthesia Care Complications:            No immediate complications. Estimated Blood Loss:     Estimated blood loss: none. Procedure:                Pre-Anesthesia Assessment:                           - Prior to the procedure, a History and Physical                            was performed, and patient medications and                            allergies were reviewed. The patient's tolerance of                            previous anesthesia was also reviewed. The risks                            and benefits of the procedure and the sedation                            options and risks were discussed with the patient.                            All questions were answered, and informed consent                            was obtained. Prior Anticoagulants: The patient has                            taken no previous anticoagulant or antiplatelet                            agents. ASA Grade Assessment: II - A patient with                            mild systemic disease. After reviewing the risks  and benefits, the patient was deemed in                            satisfactory condition to undergo the procedure.                           After obtaining informed consent, the endoscope was                            passed under direct vision. Throughout the                            procedure, the patient's blood pressure, pulse, and       oxygen saturations were monitored continuously. The                            [id number]) Olympus gastroscope was                            introduced through the mouth, and advanced to the                            second part of duodenum. The upper GI endoscopy was                            accomplished without difficulty. The patient                            tolerated the procedure well. Scope In: Scope Out: Findings:      High-grade stricturing of the esophagus was found at approximately 17 cm       from the incisors (proximal esophagus). This stenosis measured 8 mm       (inner diameter) and it was partially disrupted by the endoscope. The       stenosis was traversed after dilation. A guidewire was placed under       fluoroscopic guidance and the scope was withdrawn. Dilation was       performed with a Savary dilator with mild resistance at 12 mm. The       dilation site was examined following endoscope reinsertion and showed       moderate improvement in luminal narrowing with near mucosal trauma as a       result of stricture disruption. Seem to be an area of about 3 cm (17 to       20 cm from the incisors). Below this the endoscope traversed without       difficulty. Gastroesophageal junction was unremarkable. There was no       active inflammation.      The stomach was normal save a small hiatal hernia.      The examined duodenum was normal.      The cardia and gastric fundus were normal on retroflexion. Impression:               1. High-grade stricturing of the proximal esophagus  dilated to 12 mm as described. Moderate Sedation:      none Recommendation:           1. N.p.o. for 2 hours then clear liquids until a.m.                           2. Full liquids and pured diet for 24 hours                            thereafter                           3. Thereafter chew food exceptionally well. Selena Batten                            items  would be best.                           4. Continue current medications                           5. Repeat esophageal dilation in 4 to 6 weeks. We                            will contact you to arrange this examination. Procedure Code(s):        --- Professional ---                           [phone], Esophagogastroduodenoscopy, flexible,                            transoral; with insertion of guide wire followed by                            passage of dilator(s) through esophagus over guide                            wire Diagnosis Code(s):        --- Professional ---                           K22.2, Esophageal obstruction                           R13.10, Dysphagia, unspecified CPT copyright 2019 [employer] Medical [employer]. All rights reserved. The codes documented in this report are preliminary and upon coder review may  be revised to meet current compliance requirements. Docia Chuck. Henrene Pastor, MD 11/15/2019 10:46:27 AM This report has been signed electronically. Number of Addenda: 0

## 2019-11-17 ENCOUNTER — Encounter (HOSPITAL_COMMUNITY): Payer: Self-pay | Admitting: Internal Medicine

## 2019-11-17 ENCOUNTER — Telehealth: Payer: Self-pay

## 2019-11-17 NOTE — Telephone Encounter (Signed)
Prior authorization required for twice a day dosing.  Submitted to Cover My Meds 11/17/2019

## 2019-11-23 ENCOUNTER — Telehealth: Payer: Self-pay | Admitting: Internal Medicine

## 2019-11-23 NOTE — Telephone Encounter (Signed)
Pls call pt, she is not sure id Dr. Henrene Pastor wanted to repeat egd in 4 weeks.

## 2019-11-23 NOTE — Telephone Encounter (Signed)
Spoke to patient who was calling concerning the status of being scheduled for her 4-6 week repeat EGD. The patient was told someone would reach out to her to get her scheduled in the next week or two.

## 2019-11-30 NOTE — Telephone Encounter (Signed)
Pt called to schedule hospital EGD.

## 2019-12-08 ENCOUNTER — Other Ambulatory Visit: Payer: Self-pay

## 2019-12-08 ENCOUNTER — Telehealth: Payer: Self-pay | Admitting: Family Medicine

## 2019-12-08 DIAGNOSIS — R1310 Dysphagia, unspecified: Secondary | ICD-10-CM

## 2019-12-08 NOTE — Telephone Encounter (Signed)
Pt wanted to speak with Dr. Henrene Pastor prior to having EGD done at [facility]. Pt scheduled to see Dr. Henrene Pastor 12/17@8 :20am. Pt wants to know what the plan is for her going forward, she is quite anxious. Pt has lost 8-10 pounds and she is concerned. Pt also wants to know why she has to have her procedures done at the hospital. Pt scheduled for covid test 12/31@8 :45am. EGD with savary dilation/fluro scheduled at [facility] 12/27/19@8 :30am. Pt notified via mychart.

## 2019-12-08 NOTE — Telephone Encounter (Signed)
Patient coming for lab work tomorrow 12/09/19 and would like to have her Zinc levels checked as well. Patient says that she is at the dermatologist and they are writing a prescription or order for the Zinc test? Please advise if we can add order in for patient, thank you!

## 2019-12-08 NOTE — Telephone Encounter (Signed)
Ok to add lab

## 2019-12-08 NOTE — Telephone Encounter (Signed)
Dx E60.

## 2019-12-08 NOTE — Telephone Encounter (Signed)
Prior authorization denied.  Pt has upcoming office visit to discuss treatment plan

## 2019-12-08 NOTE — Telephone Encounter (Signed)
Yes thanks- but they need to supply suggested diagnosis code

## 2019-12-09 ENCOUNTER — Other Ambulatory Visit (INDEPENDENT_AMBULATORY_CARE_PROVIDER_SITE_OTHER): Payer: 59

## 2019-12-09 ENCOUNTER — Other Ambulatory Visit: Payer: Self-pay

## 2019-12-09 ENCOUNTER — Encounter: Payer: Self-pay | Admitting: Internal Medicine

## 2019-12-09 ENCOUNTER — Ambulatory Visit (INDEPENDENT_AMBULATORY_CARE_PROVIDER_SITE_OTHER): Payer: 59 | Admitting: Internal Medicine

## 2019-12-09 VITALS — BP 134/80 | HR 50 | Temp 97.6°F | Ht 62.0 in | Wt 99.0 lb

## 2019-12-09 DIAGNOSIS — E673 Hypervitaminosis D: Secondary | ICD-10-CM

## 2019-12-09 DIAGNOSIS — R739 Hyperglycemia, unspecified: Secondary | ICD-10-CM | POA: Diagnosis not present

## 2019-12-09 DIAGNOSIS — R1310 Dysphagia, unspecified: Secondary | ICD-10-CM

## 2019-12-09 DIAGNOSIS — E60 Dietary zinc deficiency: Secondary | ICD-10-CM

## 2019-12-09 DIAGNOSIS — R030 Elevated blood-pressure reading, without diagnosis of hypertension: Secondary | ICD-10-CM | POA: Diagnosis not present

## 2019-12-09 DIAGNOSIS — K219 Gastro-esophageal reflux disease without esophagitis: Secondary | ICD-10-CM | POA: Diagnosis not present

## 2019-12-09 DIAGNOSIS — D7589 Other specified diseases of blood and blood-forming organs: Secondary | ICD-10-CM

## 2019-12-09 DIAGNOSIS — K222 Esophageal obstruction: Secondary | ICD-10-CM | POA: Diagnosis not present

## 2019-12-09 LAB — BASIC METABOLIC PANEL
BUN: 16 mg/dL (ref 6–23)
CO2: 28 mEq/L (ref 19–32)
Calcium: 9.7 mg/dL (ref 8.4–10.5)
Chloride: 101 mEq/L (ref 96–112)
Creatinine, Ser: 0.68 mg/dL (ref 0.40–1.20)
GFR: 87.24 mL/min (ref >60.00)
Glucose, Bld: 103 mg/dL — ABNORMAL HIGH (ref 70–99)
Potassium: 4.8 mEq/L (ref 3.5–5.1)
Sodium: 138 mEq/L (ref 135–145)

## 2019-12-09 LAB — HEMOGLOBIN A1C: Hgb A1c MFr Bld: 5.5 % (ref 4.6–6.5)

## 2019-12-09 LAB — VITAMIN D 25 HYDROXY (VIT D DEFICIENCY, FRACTURES): VITD: 64.81 ng/mL (ref 30.00–100.00)

## 2019-12-09 LAB — B12 AND FOLATE PANEL
Folate: 13.2 ng/mL (ref >5.9)
Vitamin B-12: 206 pg/mL — ABNORMAL LOW (ref 211–911)

## 2019-12-09 MED ORDER — OMEPRAZOLE 20 MG PO CPDR: 20.0000 mg | DELAYED_RELEASE_CAPSULE | Freq: Every day | ORAL | 3 refills | Status: DC

## 2019-12-09 NOTE — Patient Instructions (Signed)
We have sent the following medications to your pharmacy for you to pick up at your convenience:  Omeprazole  

## 2019-12-09 NOTE — H&P (View-Only) (Signed)
HISTORY OF PRESENT ILLNESS:  Crystal [name] is a 63 y.o. female with past medical history as listed below who schedules this appointment today to discuss issues related to her esophagus.  She is accompanied by her husband.  I saw the patient on 1 occasion in 2012 for screening colonoscopy, which was normal.  About 2 months ago she presented to the emergency room with an acute food impaction which necessitated urgent endoscopy with my partner, Dr. Carlean Purl.  She was found to have a high-grade esophageal stricture.  Patient was subsequently set up for outpatient endoscopy and esophageal dilation with myself November 15, 2019.  At that time she was noted to have a high-grade proximal stenosis measuring approximately 8 mm.  She was dilated with a Savary dilator to a maximum of 12 mm.  She was continued on recently started PPI with plans for repeat outpatient dilation in 4 to 6 weeks.  This has been scheduled for December 27, 2019.  Patient wishes this visit due to multiple questions regarding her esophagus.  She wonders about advancing her diet as she has been on pured foods only.  She has not been on PPI over the past 2 weeks due to insurance reasons.  She denies history of caustic ingestion or radiation therapy.  She does acknowledge that she has had significant problems with dysphagia for many many years, but has not sought medical attention.  REVIEW OF SYSTEMS:  All non-GI ROS negative unless otherwise stated in the HPI  Past Medical History:  Diagnosis Date  . Thyroid disease 1985   grave's dzs---treated with PTU  . White coat hypertension     Past Surgical History:  Procedure Laterality Date  . BIOPSY  10/11/2019   Procedure: BIOPSY;  Surgeon: Gatha Mayer, MD;  Location: [facility];  Service: Endoscopy;;  . COLONOSCOPY    . ESOPHAGEAL DILATION  10/11/2019   Procedure: ESOPHAGEAL DILATION;  Surgeon: Gatha Mayer, MD;  Location: [facility];  Service: Endoscopy;;  .  ESOPHAGOGASTRODUODENOSCOPY N/A 10/11/2019   Procedure: ESOPHAGOGASTRODUODENOSCOPY (EGD);  Surgeon: Gatha Mayer, MD;  Location: [facility];  Service: Endoscopy;  Laterality: N/A;  . ESOPHAGOGASTRODUODENOSCOPY (EGD) WITH PROPOFOL N/A 11/15/2019   Procedure: ESOPHAGOGASTRODUODENOSCOPY (EGD) WITH PROPOFOL With savary dilation, need fluroscopy;  Surgeon: Irene Shipper, MD;  Location: [facility];  Service: Endoscopy;  Laterality: N/A;  . FOREIGN BODY REMOVAL  10/11/2019   Procedure: FOREIGN BODY REMOVAL;  Surgeon: Gatha Mayer, MD;  Location: [facility];  Service: Endoscopy;;  . Azzie Almas DILATION N/A 11/15/2019   Procedure: Azzie Almas DILATION;  Surgeon: Irene Shipper, MD;  Location: [facility];  Service: Endoscopy;  Laterality: N/A;    Social History Vernadette Sehnert  reports that she has never smoked. She has never used smokeless tobacco. She reports current alcohol use of about 14.0 standard drinks of alcohol per week. She reports that she does not use drugs.  family history includes Atrial fibrillation in her sister; Dementia in her mother; Heart attack in her father; Heart disease in her father; Heart disease (age of onset: 41) in her mother; Hypertension in her sister; Stroke in her sister.  Allergies  Allergen Reactions  . E-Mycin [Erythromycin Base] Swelling  . Other Swelling    Adhesive tape, bandaids       PHYSICAL EXAMINATION: Vital signs: BP 134/80 (BP Location: Left Arm, Patient Position: Sitting, Cuff Size: Normal)   Pulse (!) 50   Temp 97.6 F (36.4 C)   Ht 5\' 2"  (1.575 m)  Wt 99 lb (44.9 kg)   SpO2 100%   BMI 18.11 kg/m   Constitutional: Thin, generally well-appearing, no acute distress Psychiatric: alert and oriented x3, cooperative, anxious Eyes:  anicteric, conjunctiva pink Mouth: Mask Abdomen: Not reexamined Neuro: No gross deficits.  ASSESSMENT:  1.  High-grade proximal esophageal stricture with history of food impaction status post upper endoscopy  with Savary dilation to 12 mm   PLAN:  1.  Long discussion today regarding her esophageal stricture and its management 2.  Prescribe omeprazole 20 mg twice daily 3.  Keep plans for endoscopy with dilation December 27, 2019.The nature of the procedure, as well as the risks, benefits, and alternatives were carefully and thoroughly reviewed with the patient. Ample time for discussion and questions allowed. The patient understood, was satisfied, and agreed to proceed. 4.  Continue to chew food extremely carefully and avoid bulky items  25-minute spent face-to-face with the patient.  Nearly the entire time spent counseling regarding her high-grade esophageal stricture, etiologies, and management strategies.  Multiple questions for the patient and her husband answered to their satisfaction.

## 2019-12-09 NOTE — Telephone Encounter (Signed)
Order placed patient has came in for labs

## 2019-12-09 NOTE — Addendum Note (Signed)
Addended by: Francis Dowse T on: 12/09/2019 10:40 AM   Modules accepted: Orders

## 2019-12-09 NOTE — Progress Notes (Signed)
HISTORY OF PRESENT ILLNESS:  Crystal [name] is a 63 y.o. female with past medical history as listed below who schedules this appointment today to discuss issues related to her esophagus.  She is accompanied by her husband.  I saw the patient on 1 occasion in 2012 for screening colonoscopy, which was normal.  About 2 months ago she presented to the emergency room with an acute food impaction which necessitated urgent endoscopy with my partner, Dr. Carlean Purl.  She was found to have a high-grade esophageal stricture.  Patient was subsequently set up for outpatient endoscopy and esophageal dilation with myself November 15, 2019.  At that time she was noted to have a high-grade proximal stenosis measuring approximately 8 mm.  She was dilated with a Savary dilator to a maximum of 12 mm.  She was continued on recently started PPI with plans for repeat outpatient dilation in 4 to 6 weeks.  This has been scheduled for December 27, 2019.  Patient wishes this visit due to multiple questions regarding her esophagus.  She wonders about advancing her diet as she has been on pured foods only.  She has not been on PPI over the past 2 weeks due to insurance reasons.  She denies history of caustic ingestion or radiation therapy.  She does acknowledge that she has had significant problems with dysphagia for many many years, but has not sought medical attention.  REVIEW OF SYSTEMS:  All non-GI ROS negative unless otherwise stated in the HPI  Past Medical History:  Diagnosis Date  . Thyroid disease 1985   grave's dzs---treated with PTU  . White coat hypertension     Past Surgical History:  Procedure Laterality Date  . BIOPSY  10/11/2019   Procedure: BIOPSY;  Surgeon: Gatha Mayer, MD;  Location: [facility];  Service: Endoscopy;;  . COLONOSCOPY    . ESOPHAGEAL DILATION  10/11/2019   Procedure: ESOPHAGEAL DILATION;  Surgeon: Gatha Mayer, MD;  Location: [facility];  Service: Endoscopy;;  .  ESOPHAGOGASTRODUODENOSCOPY N/A 10/11/2019   Procedure: ESOPHAGOGASTRODUODENOSCOPY (EGD);  Surgeon: Gatha Mayer, MD;  Location: [facility];  Service: Endoscopy;  Laterality: N/A;  . ESOPHAGOGASTRODUODENOSCOPY (EGD) WITH PROPOFOL N/A 11/15/2019   Procedure: ESOPHAGOGASTRODUODENOSCOPY (EGD) WITH PROPOFOL With savary dilation, need fluroscopy;  Surgeon: Irene Shipper, MD;  Location: [facility];  Service: Endoscopy;  Laterality: N/A;  . FOREIGN BODY REMOVAL  10/11/2019   Procedure: FOREIGN BODY REMOVAL;  Surgeon: Gatha Mayer, MD;  Location: [facility];  Service: Endoscopy;;  . Azzie Almas DILATION N/A 11/15/2019   Procedure: Azzie Almas DILATION;  Surgeon: Irene Shipper, MD;  Location: [facility];  Service: Endoscopy;  Laterality: N/A;    Social History Crystal [name]  reports that she has never smoked. She has never used smokeless tobacco. She reports current alcohol use of about 14.0 standard drinks of alcohol per week. She reports that she does not use drugs.  family history includes Atrial fibrillation in her sister; Dementia in her mother; Heart attack in her father; Heart disease in her father; Heart disease (age of onset: 77) in her mother; Hypertension in her sister; Stroke in her sister.  Allergies  Allergen Reactions  . E-Mycin [Erythromycin Base] Swelling  . Other Swelling    Adhesive tape, bandaids       PHYSICAL EXAMINATION: Vital signs: BP 134/80 (BP Location: Left Arm, Patient Position: Sitting, Cuff Size: Normal)   Pulse (!) 50   Temp 97.6 F (36.4 C)   Ht 5\' 2"  (1.575 m)  Wt 99 lb (44.9 kg)   SpO2 100%   BMI 18.11 kg/m   Constitutional: Thin, generally well-appearing, no acute distress Psychiatric: alert and oriented x3, cooperative, anxious Eyes:  anicteric, conjunctiva pink Mouth: Mask Abdomen: Not reexamined Neuro: No gross deficits.  ASSESSMENT:  1.  High-grade proximal esophageal stricture with history of food impaction status post upper endoscopy  with Savary dilation to 12 mm   PLAN:  1.  Long discussion today regarding her esophageal stricture and its management 2.  Prescribe omeprazole 20 mg twice daily 3.  Keep plans for endoscopy with dilation December 27, 2019.The nature of the procedure, as well as the risks, benefits, and alternatives were carefully and thoroughly reviewed with the patient. Ample time for discussion and questions allowed. The patient understood, was satisfied, and agreed to proceed. 4.  Continue to chew food extremely carefully and avoid bulky items  25-minute spent face-to-face with the patient.  Nearly the entire time spent counseling regarding her high-grade esophageal stricture, etiologies, and management strategies.  Multiple questions for the patient and her husband answered to their satisfaction.

## 2019-12-09 NOTE — Telephone Encounter (Signed)
Yes thanks-you may add this lab

## 2019-12-10 ENCOUNTER — Encounter: Payer: Self-pay | Admitting: Family Medicine

## 2019-12-11 LAB — ZINC: Zinc: 73 ug/dL (ref 60–130)

## 2019-12-21 ENCOUNTER — Encounter (HOSPITAL_COMMUNITY): Payer: Self-pay | Admitting: Internal Medicine

## 2019-12-23 ENCOUNTER — Other Ambulatory Visit (HOSPITAL_COMMUNITY)
Admission: RE | Admit: 2019-12-23 | Discharge: 2019-12-23 | Disposition: A | Discharge status: Home / Self Care | Payer: 59 | Source: Ambulatory Visit | Attending: Internal Medicine | Admitting: Internal Medicine

## 2019-12-23 DIAGNOSIS — Z01812 Encounter for preprocedural laboratory examination: Secondary | ICD-10-CM | POA: Insufficient documentation

## 2019-12-23 DIAGNOSIS — Z20828 Contact with and (suspected) exposure to other viral communicable diseases: Secondary | ICD-10-CM | POA: Insufficient documentation

## 2019-12-24 LAB — NOVEL CORONAVIRUS, NAA (HOSP ORDER, SEND-OUT TO REF LAB; TAT 18-24 HRS): SARS-CoV-2, NAA: NOT DETECTED

## 2019-12-26 NOTE — Anesthesia Preprocedure Evaluation (Addendum)
Anesthesia Evaluation  Patient identified by MRN, date of birth, ID band Patient awake    Reviewed: Allergy & Precautions, H&P , NPO status , Patient's Chart, lab work & pertinent test results, reviewed documented beta blocker date and time   Airway Mallampati: II  TM Distance: >3 FB Neck ROM: Full    Dental no notable dental hx. (+) Dental Advisory Given   Pulmonary neg pulmonary ROS,    Pulmonary exam normal        Cardiovascular Exercise Tolerance: Good hypertension, Normal cardiovascular exam     Neuro/Psych negative neurological ROS  negative psych ROS   GI/Hepatic Neg liver ROS, GERD  Medicated,  Endo/Other  Hypothyroidism   Renal/GU negative Renal ROS  negative genitourinary   Musculoskeletal   Abdominal   Peds  Hematology negative hematology ROS (+)   Anesthesia Other Findings   Reproductive/Obstetrics negative OB ROS                            Anesthesia Physical  Anesthesia Plan  ASA: II  Anesthesia Plan: MAC   Post-op Pain Management:    Induction:   PONV Risk Score and Plan: 2 and Ondansetron and Propofol infusion  Airway Management Planned: Natural Airway  Additional Equipment:   Intra-op Plan:   Post-operative Plan:   Informed Consent: I have reviewed the patients History and Physical, chart, labs and discussed the procedure including the risks, benefits and alternatives for the proposed anesthesia with the patient or authorized representative who has indicated his/her understanding and acceptance.     Dental advisory given  Plan Discussed with: Anesthesiologist and CRNA  Anesthesia Plan Comments:        Anesthesia Quick Evaluation

## 2019-12-27 ENCOUNTER — Ambulatory Visit (HOSPITAL_COMMUNITY)
Admission: RE | Admit: 2019-12-27 | Discharge: 2019-12-27 | Disposition: A | Discharge status: Home / Self Care | Payer: 59 | Attending: Internal Medicine | Admitting: Internal Medicine

## 2019-12-27 ENCOUNTER — Ambulatory Visit (HOSPITAL_COMMUNITY): Payer: 59 | Admitting: Certified Registered"

## 2019-12-27 ENCOUNTER — Encounter (HOSPITAL_COMMUNITY)
Admission: RE | Disposition: A | Discharge status: Home / Self Care | Payer: Self-pay | Source: Home / Self Care | Attending: Internal Medicine

## 2019-12-27 ENCOUNTER — Encounter (HOSPITAL_COMMUNITY): Payer: Self-pay | Admitting: Internal Medicine

## 2019-12-27 ENCOUNTER — Ambulatory Visit (HOSPITAL_COMMUNITY): Payer: 59

## 2019-12-27 ENCOUNTER — Other Ambulatory Visit: Payer: Self-pay

## 2019-12-27 DIAGNOSIS — Z881 Allergy status to other antibiotic agents status: Secondary | ICD-10-CM | POA: Insufficient documentation

## 2019-12-27 DIAGNOSIS — R1310 Dysphagia, unspecified: Secondary | ICD-10-CM

## 2019-12-27 DIAGNOSIS — E039 Hypothyroidism, unspecified: Secondary | ICD-10-CM | POA: Diagnosis not present

## 2019-12-27 DIAGNOSIS — K222 Esophageal obstruction: Secondary | ICD-10-CM

## 2019-12-27 DIAGNOSIS — Z79899 Other long term (current) drug therapy: Secondary | ICD-10-CM | POA: Diagnosis not present

## 2019-12-27 DIAGNOSIS — I10 Essential (primary) hypertension: Secondary | ICD-10-CM | POA: Diagnosis not present

## 2019-12-27 DIAGNOSIS — Z8719 Personal history of other diseases of the digestive system: Secondary | ICD-10-CM

## 2019-12-27 HISTORY — PX: ESOPHAGOGASTRODUODENOSCOPY (EGD) WITH PROPOFOL: SHX5813

## 2019-12-27 HISTORY — PX: SAVORY DILATION: SHX5439

## 2019-12-27 HISTORY — DX: Other complications of anesthesia, initial encounter: T88.59XA

## 2019-12-27 SURGERY — ESOPHAGOGASTRODUODENOSCOPY (EGD) WITH PROPOFOL
Anesthesia: Monitor Anesthesia Care

## 2019-12-27 MED ORDER — PROPOFOL 10 MG/ML IV BOLUS
INTRAVENOUS | Status: AC
  Filled 2019-12-27: qty 60

## 2019-12-27 MED ORDER — PROPOFOL 10 MG/ML IV BOLUS
INTRAVENOUS | Status: AC
  Filled 2019-12-27: qty 20

## 2019-12-27 MED ORDER — LIDOCAINE HCL (CARDIAC) PF 100 MG/5ML IV SOSY
PREFILLED_SYRINGE | INTRAVENOUS | Status: DC | PRN
  Administered 2019-12-27: 60 mg via INTRATRACHEAL

## 2019-12-27 MED ORDER — PROPOFOL 500 MG/50ML IV EMUL
INTRAVENOUS | Status: AC
  Filled 2019-12-27: qty 50

## 2019-12-27 MED ORDER — PROPOFOL 500 MG/50ML IV EMUL
INTRAVENOUS | Status: DC | PRN
  Administered 2019-12-27: 40 mg via INTRAVENOUS
  Administered 2019-12-27 (×2): 30 mg via INTRAVENOUS

## 2019-12-27 MED ORDER — PROPOFOL 500 MG/50ML IV EMUL
INTRAVENOUS | Status: AC
  Filled 2019-12-27: qty 100

## 2019-12-27 MED ORDER — SODIUM CHLORIDE 0.9 % IV SOLN: INTRAVENOUS | Status: DC

## 2019-12-27 MED ORDER — PROPOFOL 500 MG/50ML IV EMUL
INTRAVENOUS | Status: DC | PRN
  Administered 2019-12-27: 135 ug/kg/min via INTRAVENOUS

## 2019-12-27 MED ORDER — LACTATED RINGERS IV SOLN
INTRAVENOUS | Status: DC
  Administered 2019-12-27: 1000 mL via INTRAVENOUS

## 2019-12-27 SURGICAL SUPPLY — 15 items

## 2019-12-27 NOTE — Discharge Instructions (Signed)
Nothing by mouth for 2 hours until 11 am.  Full liquids for rest of today.  Start tomorrow with soft foods.     YOU HAD AN ENDOSCOPIC PROCEDURE TODAY: Refer to the procedure report and other information in the discharge instructions given to you for any specific questions about what was found during the examination. If this information does not answer your questions, please call [place] office at [phone] to clarify.   YOU SHOULD EXPECT: Some feelings of bloating in the abdomen. Passage of more gas than usual. Walking can help get rid of the air that was put into your GI tract during the procedure and reduce the bloating. If you had a lower endoscopy (such as a colonoscopy or flexible sigmoidoscopy) you may notice spotting of blood in your stool or on the toilet paper. Some abdominal soreness may be present for a day or two, also.  DIET: Your first meal following the procedure should be a light meal and then it is ok to progress to your normal diet. A half-sandwich or bowl of soup is an example of a good first meal. Heavy or fried foods are harder to digest and may make you feel nauseous or bloated. Drink plenty of fluids but you should avoid alcoholic beverages for 24 hours. If you had a esophageal dilation, please see attached instructions for diet.    ACTIVITY: Your care partner should take you home directly after the procedure. You should plan to take it easy, moving slowly for the rest of the day. You can resume normal activity the day after the procedure however YOU SHOULD NOT DRIVE, use power tools, machinery or perform tasks that involve climbing or major physical exertion for 24 hours (because of the sedation medicines used during the test).   SYMPTOMS TO REPORT IMMEDIATELY: A gastroenterologist can be reached at any hour. Please call [phone]  for any of the following symptoms:   Following upper endoscopy (EGD, EUS, ERCP, esophageal dilation) Vomiting of blood or coffee ground  material  New, significant abdominal pain  New, significant chest pain or pain under the shoulder blades  Painful or persistently difficult swallowing  New shortness of breath  Black, tarry-looking or red, bloody stools  FOLLOW UP:  If any biopsies were taken you will be contacted by phone or by letter within the next 1-3 weeks. Call [phone]  if you have not heard about the biopsies in 3 weeks.  Please also call with any specific questions about appointments or follow up tests.

## 2019-12-27 NOTE — Anesthesia Postprocedure Evaluation (Signed)
Anesthesia Post Note  Patient: Crystal [name]  Procedure(s) Performed: ESOPHAGOGASTRODUODENOSCOPY (EGD) WITH PROPOFOL (N/A ) SAVORY DILATION (N/A )     Patient location during evaluation: Endoscopy Anesthesia Type: MAC Level of consciousness: awake and alert Pain management: pain level controlled Vital Signs Assessment: post-procedure vital signs reviewed and stable Respiratory status: spontaneous breathing, nonlabored ventilation, respiratory function stable and patient connected to nasal cannula oxygen Cardiovascular status: blood pressure returned to baseline and stable Postop Assessment: no apparent nausea or vomiting Anesthetic complications: no    Last Vitals:  Vitals:   12/27/19 0910 12/27/19 0920  BP: 107/64 122/65  Pulse: 83 79  Resp: 19 17  Temp:    SpO2: 100% 97%    Last Pain:  Vitals:   12/27/19 0920  TempSrc:   PainSc: 5                  Liliann File DANIEL

## 2019-12-27 NOTE — Transfer of Care (Signed)
Immediate Anesthesia Transfer of Care Note  Patient: Crystal [name]  Procedure(s) Performed: ESOPHAGOGASTRODUODENOSCOPY (EGD) WITH PROPOFOL (N/A ) SAVORY DILATION (N/A )  Patient Location: Endoscopy Unit  Anesthesia Type:MAC  Level of Consciousness: awake, patient cooperative and responds to stimulation  Airway & Oxygen Therapy: Patient Spontanous Breathing and Patient connected to face mask oxygen  Post-op Assessment: Report given to RN and Post -op Vital signs reviewed and stable  Post vital signs: Reviewed and stable  Last Vitals:  Vitals Value Taken Time  BP 108/54 12/27/19 0904  Temp 36.2 C 12/27/19 0904  Pulse 90 12/27/19 0904  Resp 27 12/27/19 0904  SpO2 100 % 12/27/19 0904    Last Pain:  Vitals:   12/27/19 0904  TempSrc: Axillary  PainSc: 0-No pain         Complications: No apparent anesthesia complications

## 2019-12-27 NOTE — Op Note (Signed)
[facility] Patient Name: Crystal [name] Procedure Date: 12/27/2019 MRN: [id number] Attending MD: Docia Chuck. Henrene Pastor , MD Date of Birth: 1956-04-12 CSN: [id number] Age: 64 Admit Type: Outpatient Procedure:                Upper GI endoscopy with Savary dilation of the                            esophagus, 15 mm Indications:              Therapeutic procedure, Dysphagia. Known high-grade                            esophageal stricture. Last dilated 6 weeks ago to                            12 mm Providers:                Docia Chuck. Henrene Pastor, MD, Ashley Jacobs, RN, Elspeth Cho Tech., Technician Referring MD:              Medicines:                Monitored Anesthesia Care Complications:            No immediate complications. Estimated Blood Loss:     Estimated blood loss: none. Procedure:                Pre-Anesthesia Assessment:                           - Prior to the procedure, a History and Physical                            was performed, and patient medications and                            allergies were reviewed. The patient's tolerance of                            previous anesthesia was also reviewed. The risks                            and benefits of the procedure and the sedation                            options and risks were discussed with the patient.                            All questions were answered, and informed consent                            was obtained. Prior Anticoagulants: The patient has  taken no previous anticoagulant or antiplatelet                            agents. ASA Grade Assessment: II - A patient with                            mild systemic disease. After reviewing the risks                            and benefits, the patient was deemed in                            satisfactory condition to undergo the procedure.                           After obtaining informed consent, the  endoscope was                            passed under direct vision. Throughout the                            procedure, the patient's blood pressure, pulse, and                            oxygen saturations were monitored continuously. The                            [id number]-[id number]) Olympus gastroscope was                            introduced through the mouth, and advanced to the                            second part of duodenum. The upper GI endoscopy was                            accomplished without difficulty. The patient                            tolerated the procedure well. Scope In: Scope Out: Findings:      The esophagus revealed a high-grade proximal stricture at approximately       20 cm. This measured about 10 mm. Improved appearance from previous. The       stenosis was traversed with minimal trauma. There was larger caliber       ringed appearance to the midportion of the esophagus with more classic       appearing large caliber peptic stricture at the gastroesophageal       junction. After completing the endoscopic survey, A guidewire was placed       under fluoroscopic guidance and the scope was withdrawn. Dilation was       performed with a Savary dilator with no resistance at 15 mm. The       dilation site was examined and showed moderate mucosal disruption. See       images  The stomach was normal.      The examined duodenum was normal.      The cardia and gastric fundus were normal on retroflexion. Impression:               - Esophageal stenosis, high-grade. Dilated.                           - Normal stomach.                           - Normal examined duodenum.                           - No specimens collected. Moderate Sedation:      none Recommendation:           - Patient has a contact number available for                            emergencies. The signs and symptoms of potential                            delayed complications were discussed  with the                            patient. Return to normal activities tomorrow.                            Written discharge instructions were provided to the                            patient.                           - Post dilation diet. N.p.o. for 2 hours, then                            clear liquids till a.m. Resume pured and soft diet                            thereafter.                           - Continue present medications.                           - Recommend repeat dilation in 4 to 6 weeks. We                            will contact you regarding scheduling Procedure Code(s):        --- Professional ---                           [phone], Esophagogastroduodenoscopy, flexible,                            transoral; with insertion of guide wire followed by  passage of dilator(s) through esophagus over guide                            wire Diagnosis Code(s):        --- Professional ---                           K22.2, Esophageal obstruction                           R13.10, Dysphagia, unspecified CPT copyright 2019 [employer]. All rights reserved. The codes documented in this report are preliminary and upon coder review may  be revised to meet current compliance requirements. Docia Chuck. Henrene Pastor, MD 12/27/2019 9:34:30 AM This report has been signed electronically. Number of Addenda: 0

## 2019-12-27 NOTE — Interval H&P Note (Signed)
History and Physical Interval Note:  12/27/2019 7:53 AM  Crystal [name]  has presented today for surgery, with the diagnosis of Dysphagia.  The various methods of treatment have [name] discussed with the patient and family. After consideration of risks, benefits and other options for treatment, the patient has consented to  Procedure(s) with comments: ESOPHAGOGASTRODUODENOSCOPY (EGD) WITH PROPOFOL (N/A) SAVORY DILATION (N/A) - Needs Fluroscopy as a surgical intervention.  The patient's history has [name] reviewed, patient examined, no change in status, stable for surgery.  I have reviewed the patient's chart and labs.  Questions were answered to the patient's satisfaction.     Scarlette Shorts

## 2019-12-29 ENCOUNTER — Encounter: Payer: Self-pay | Admitting: *Deleted

## 2020-01-20 ENCOUNTER — Telehealth: Payer: Self-pay

## 2020-01-20 ENCOUNTER — Other Ambulatory Visit: Payer: Self-pay

## 2020-01-20 DIAGNOSIS — K222 Esophageal obstruction: Secondary | ICD-10-CM

## 2020-01-20 DIAGNOSIS — R1310 Dysphagia, unspecified: Secondary | ICD-10-CM

## 2020-01-20 NOTE — Telephone Encounter (Signed)
Call placed to pt to see if 2/15 will work for her next egd with dilation/fluro at [facility]. Left message for pt to call back.

## 2020-01-20 NOTE — Telephone Encounter (Signed)
Pt aware of appts. She will need to rearrange some appts and see if she can make this date work. Pt states she will call back to confirm.

## 2020-01-21 NOTE — Telephone Encounter (Signed)
Pt called back to confirm she will keep the appt as scheduled.

## 2020-02-01 ENCOUNTER — Encounter (HOSPITAL_COMMUNITY): Payer: Self-pay | Admitting: Internal Medicine

## 2020-02-01 NOTE — Progress Notes (Signed)
Attempted to obtain medical history via telephone, unable to reach at this time. I left a voicemail to return pre surgical testing department's phone call.  

## 2020-02-03 ENCOUNTER — Other Ambulatory Visit (HOSPITAL_COMMUNITY)
Admission: RE | Admit: 2020-02-03 | Discharge: 2020-02-03 | Disposition: A | Discharge status: Home / Self Care | Payer: 59 | Source: Ambulatory Visit | Attending: Internal Medicine | Admitting: Internal Medicine

## 2020-02-03 DIAGNOSIS — Z20822 Contact with and (suspected) exposure to covid-19: Secondary | ICD-10-CM | POA: Diagnosis not present

## 2020-02-03 DIAGNOSIS — Z01812 Encounter for preprocedural laboratory examination: Secondary | ICD-10-CM | POA: Diagnosis present

## 2020-02-03 LAB — SARS CORONAVIRUS 2 (TAT 6-24 HRS): SARS Coronavirus 2: NEGATIVE

## 2020-02-04 ENCOUNTER — Other Ambulatory Visit (HOSPITAL_COMMUNITY): Payer: 59

## 2020-02-07 ENCOUNTER — Other Ambulatory Visit: Payer: Self-pay

## 2020-02-07 ENCOUNTER — Ambulatory Visit (HOSPITAL_COMMUNITY): Payer: 59 | Admitting: Anesthesiology

## 2020-02-07 ENCOUNTER — Encounter (HOSPITAL_COMMUNITY)
Admission: RE | Disposition: A | Discharge status: Home / Self Care | Payer: Self-pay | Source: Home / Self Care | Attending: Internal Medicine

## 2020-02-07 ENCOUNTER — Ambulatory Visit (HOSPITAL_COMMUNITY): Payer: 59

## 2020-02-07 ENCOUNTER — Ambulatory Visit (HOSPITAL_COMMUNITY)
Admission: RE | Admit: 2020-02-07 | Discharge: 2020-02-07 | Disposition: A | Discharge status: Home / Self Care | Payer: 59 | Attending: Internal Medicine | Admitting: Internal Medicine

## 2020-02-07 ENCOUNTER — Encounter (HOSPITAL_COMMUNITY): Payer: Self-pay | Admitting: Internal Medicine

## 2020-02-07 DIAGNOSIS — K449 Diaphragmatic hernia without obstruction or gangrene: Secondary | ICD-10-CM | POA: Insufficient documentation

## 2020-02-07 DIAGNOSIS — R030 Elevated blood-pressure reading, without diagnosis of hypertension: Secondary | ICD-10-CM | POA: Diagnosis not present

## 2020-02-07 DIAGNOSIS — R1310 Dysphagia, unspecified: Secondary | ICD-10-CM | POA: Diagnosis present

## 2020-02-07 DIAGNOSIS — K222 Esophageal obstruction: Secondary | ICD-10-CM | POA: Diagnosis not present

## 2020-02-07 HISTORY — PX: ESOPHAGOGASTRODUODENOSCOPY (EGD) WITH PROPOFOL: SHX5813

## 2020-02-07 HISTORY — DX: Hyperlipidemia, unspecified: E78.5

## 2020-02-07 HISTORY — DX: Insomnia, unspecified: G47.00

## 2020-02-07 HISTORY — DX: Presence of spectacles and contact lenses: Z97.3

## 2020-02-07 HISTORY — DX: Esophageal obstruction: K22.2

## 2020-02-07 HISTORY — DX: Age-related osteoporosis without current pathological fracture: M81.0

## 2020-02-07 HISTORY — PX: SAVORY DILATION: SHX5439

## 2020-02-07 SURGERY — ESOPHAGOGASTRODUODENOSCOPY (EGD) WITH PROPOFOL
Anesthesia: Monitor Anesthesia Care

## 2020-02-07 MED ORDER — PROPOFOL 10 MG/ML IV BOLUS
INTRAVENOUS | Status: DC | PRN
  Administered 2020-02-07: 40 mg via INTRAVENOUS
  Administered 2020-02-07 (×4): 20 mg via INTRAVENOUS
  Administered 2020-02-07: 40 mg via INTRAVENOUS

## 2020-02-07 MED ORDER — LACTATED RINGERS IV SOLN
INTRAVENOUS | Status: AC | PRN
  Administered 2020-02-07: 1000 mL via INTRAVENOUS

## 2020-02-07 MED ORDER — PROPOFOL 500 MG/50ML IV EMUL
INTRAVENOUS | Status: DC | PRN
  Administered 2020-02-07: 100 ug/kg/min via INTRAVENOUS

## 2020-02-07 MED ORDER — SODIUM CHLORIDE 0.9 % IV SOLN: INTRAVENOUS | Status: DC

## 2020-02-07 MED ORDER — PROPOFOL 500 MG/50ML IV EMUL
INTRAVENOUS | Status: AC
  Filled 2020-02-07: qty 50

## 2020-02-07 SURGICAL SUPPLY — 15 items

## 2020-02-07 NOTE — Op Note (Signed)
[facility] Patient Name: Crystal [name] Procedure Date: 02/07/2020 MRN: [id number] Attending MD: Docia Chuck. Henrene Pastor , MD Date of Birth: 10/03/1956 CSN: [id number] Age: 64 Admit Type: Outpatient Procedure:                Upper GI endoscopy with Savary dilation of the                            esophagus?"17 mm Indications:              Dysphagia, Therapeutic procedure Providers:                Docia Chuck. Henrene Pastor, MD, Kary Kos, RN, Cleda Daub,                            RN, Theodora Blow, Technician Referring MD:             Dr. Yong Channel Medicines:                Monitored Anesthesia Care Complications:            No immediate complications. Estimated Blood Loss:     Estimated blood loss: none. Procedure:                Pre-Anesthesia Assessment:                           - Prior to the procedure, a History and Physical                            was performed, and patient medications and                            allergies were reviewed. The patient's tolerance of                            previous anesthesia was also reviewed. The risks                            and benefits of the procedure and the sedation                            options and risks were discussed with the patient.                            All questions were answered, and informed consent                            was obtained. Prior Anticoagulants: The patient has                            taken no previous anticoagulant or antiplatelet                            agents. ASA Grade Assessment: II - A patient with  mild systemic disease. After reviewing the risks                            and benefits, the patient was deemed in                            satisfactory condition to undergo the procedure.                           After obtaining informed consent, the endoscope was                            passed under direct vision. Throughout the      procedure, the patient's blood pressure, pulse, and                            oxygen saturations were monitored continuously. The                            [id number]) Olympus gastroscope was                            introduced through the mouth, and advanced to the                            second part of duodenum. The upper GI endoscopy was                            accomplished without difficulty. The patient                            tolerated the procedure well. Scope In: Scope Out: Findings:      Multiple benign-appearing, intrinsic moderate stenoses were found. The       narrowest stenosis measured 1.3 cm (inner diameter) and was located at       approximately 20 mm from the incisors. The appearance was improved from       previous examinations. A guidewire was placed under fluoroscopic       guidance and the scope was withdrawn. Dilation was performed with a       Savary dilator with no resistance at 17 mm. The dilation site was       examined following endoscope reinsertion and showed moderate mucosal       disruption.      The exam of the esophagus also revealed large caliber distal stricture       at the gastroesophageal junction with mild inflammation.      The stomach was normal save small hiatal hernia.      The examined duodenum was normal.      The cardia and gastric fundus were normal on retroflexion. Impression:               - Benign-appearing esophageal stenoses. Dilated.                           - Normal stomach.                           -  Normal examined duodenum.                           - No specimens collected. Moderate Sedation:      none Recommendation:           1. N.p.o. for 2 hours, then clear liquids for 2                            hours, then full liquids until tomorrow.                           2. May try soft diet in addition to pured foods                           3. Increase omeprazole from 20 mg daily to 40 mg                             daily                           4. Outpatient office follow-up with Dr. Henrene Pastor in 3                            months. Contact the office in the interim if you                            have any questions or problems. Procedure Code(s):        --- Professional ---                           [phone], Esophagogastroduodenoscopy, flexible,                            transoral; with insertion of guide wire followed by                            passage of dilator(s) through esophagus over guide                            wire Diagnosis Code(s):        --- Professional ---                           K22.2, Esophageal obstruction                           R13.10, Dysphagia, unspecified CPT copyright 2019 [employer]. All rights reserved. The codes documented in this report are preliminary and upon coder review may  be revised to meet current compliance requirements. Docia Chuck. Henrene Pastor, MD 02/07/2020 10:39:50 AM This report has been signed electronically. Number of Addenda: 0

## 2020-02-07 NOTE — H&P (Signed)
HISTORY OF PRESENT ILLNESS:  Crystal [name] is a 64 y.o. female with high-grade esophageal stricture complicated by food impaction October 2020.  Subsequent esophageal dilation November 2020 and early January 2021.  Last examination Savary dilation to 16 mm.  Swallowing has been better though she is afraid to have a recurrent food impaction.  Mostly pured food though she did tolerate a [employer] hamburger.  She is now for repeat endoscopy with dilation.  She continues on daily PPI therapy  REVIEW OF SYSTEMS:  All non-GI ROS negative unless otherwise stated in the HPI  Past Medical History:  Diagnosis Date  . Complication of anesthesia    kept a severe HA for 10 days after    . Esophageal stenosis   . Graves' disease 1985   treated with PTU  . Hyperlipidemia   . Insomnia   . Osteoporosis   . Wears glasses   . White coat hypertension     Past Surgical History:  Procedure Laterality Date  . BIOPSY  10/11/2019   Procedure: BIOPSY;  Surgeon: Gatha Mayer, MD;  Location: [facility];  Service: Endoscopy;;  . COLONOSCOPY    . ESOPHAGEAL DILATION  10/11/2019   Procedure: ESOPHAGEAL DILATION;  Surgeon: Gatha Mayer, MD;  Location: [facility];  Service: Endoscopy;;  . ESOPHAGOGASTRODUODENOSCOPY N/A 10/11/2019   Procedure: ESOPHAGOGASTRODUODENOSCOPY (EGD);  Surgeon: Gatha Mayer, MD;  Location: [facility];  Service: Endoscopy;  Laterality: N/A;  . ESOPHAGOGASTRODUODENOSCOPY (EGD) WITH PROPOFOL N/A 11/15/2019   Procedure: ESOPHAGOGASTRODUODENOSCOPY (EGD) WITH PROPOFOL With savary dilation, need fluroscopy;  Surgeon: Irene Shipper, MD;  Location: [facility];  Service: Endoscopy;  Laterality: N/A;  . ESOPHAGOGASTRODUODENOSCOPY (EGD) WITH PROPOFOL N/A 12/27/2019   Procedure: ESOPHAGOGASTRODUODENOSCOPY (EGD) WITH PROPOFOL;  Surgeon: Irene Shipper, MD;  Location: [facility];  Service: Endoscopy;  Laterality: N/A;  . FOREIGN BODY REMOVAL  10/11/2019   Procedure: FOREIGN BODY  REMOVAL;  Surgeon: Gatha Mayer, MD;  Location: [facility];  Service: Endoscopy;;  . Azzie Almas DILATION N/A 11/15/2019   Procedure: Azzie Almas DILATION;  Surgeon: Irene Shipper, MD;  Location: [facility];  Service: Endoscopy;  Laterality: N/A;  . SAVORY DILATION N/A 12/27/2019   Procedure: SAVORY DILATION;  Surgeon: Irene Shipper, MD;  Location: [facility];  Service: Endoscopy;  Laterality: N/A;  Needs Fluroscopy    Social History Crystal [name]  reports that she has never smoked. She has never used smokeless tobacco. She reports current alcohol use of about 14.0 standard drinks of alcohol per week. She reports that she does not use drugs.  family history includes Atrial fibrillation in her sister; Dementia in her mother; Heart attack in her father; Heart disease in her father; Heart disease (age of onset: 29) in her mother; Hypertension in her sister; Stroke in her sister.  Allergies  Allergen Reactions  . Adhesive [Tape] Swelling  . E-Mycin [Erythromycin Base] Swelling       PHYSICAL EXAMINATION: Vital signs: Ht 5\' 2"  (1.575 m)   Wt 44 kg   BMI 17.74 kg/m   Constitutional: generally well-appearing, no acute distress Psychiatric: alert and oriented x3, cooperative Eyes: extraocular movements intact, anicteric, conjunctiva pink Mouth: oral pharynx moist, no lesions Neck: supple no lymphadenopathy Cardiovascular: heart regular rate and rhythm, no murmur Lungs: clear to auscultation bilaterally Abdomen: soft, nontender, nondistended, no obvious ascites, no peritoneal signs, normal bowel sounds, no organomegaly Rectal: Omitted Extremities: no clubbing, cyanosis, or lower extremity edema bilaterally Skin: no lesions on visible extremities Neuro: No focal  deficits.  Cranial nerves intact  ASSESSMENT:  1.  High-grade esophageal stricture with history of food impaction with previous esophageal dilation   PLAN:  1.  Repeat endoscopy with esophageal dilation.  Patient is high risk  given the esophageal anatomy.The nature of the procedure, as well as the risks, benefits, and alternatives were carefully and thoroughly reviewed with the patient. Ample time for discussion and questions allowed. The patient understood, was satisfied, and agreed to proceed.

## 2020-02-07 NOTE — Transfer of Care (Signed)
Immediate Anesthesia Transfer of Care Note  Patient: Crystal [name]  Procedure(s) Performed: Procedure(s) with comments: ESOPHAGOGASTRODUODENOSCOPY (EGD) WITH PROPOFOL (N/A) SAVORY DILATION (N/A) - Need Fluro  Patient Location: PACU  Anesthesia Type:MAC  Level of Consciousness:  sedated, patient cooperative and responds to stimulation  Airway & Oxygen Therapy:Patient Spontanous Breathing and Patient connected to face mask oxgen  Post-op Assessment:  Report given to PACU RN and Post -op Vital signs reviewed and stable  Post vital signs:  Reviewed and stable  Last Vitals:  Vitals:   02/07/20 0854  BP: (!) 167/77  Pulse: 76  Resp: 14  Temp: 36.7 C  SpO2: 00%    Complications: No apparent anesthesia complications

## 2020-02-07 NOTE — Discharge Instructions (Signed)
YOU HAD AN ENDOSCOPIC PROCEDURE TODAY: Refer to the procedure report and other information in the discharge instructions given to you for any specific questions about what was found during the examination. If this information does not answer your questions, please call [place] office at [phone] to clarify.  ° °YOU SHOULD EXPECT: Some feelings of bloating in the abdomen. Passage of more gas than usual. Walking can help get rid of the air that was put into your GI tract during the procedure and reduce the bloating.. ° °DIET: Your first meal following the procedure should be a light meal and then it is ok to progress to your normal diet. A half-sandwich or bowl of soup is an example of a good first meal. Heavy or fried foods are harder to digest and may make you feel nauseous or bloated. Drink plenty of fluids but you should avoid alcoholic beverages for 24 hours. If you had a esophageal dilation, please see attached instructions for diet.   ° °ACTIVITY: Your care partner should take you home directly after the procedure. You should plan to take it easy, moving slowly for the rest of the day. You can resume normal activity the day after the procedure however YOU SHOULD NOT DRIVE, use power tools, machinery or perform tasks that involve climbing or major physical exertion for 24 hours (because of the sedation medicines used during the test).  ° °SYMPTOMS TO REPORT IMMEDIATELY: °A gastroenterologist can be reached at any hour. Please call [phone]  for any of the following symptoms:  ° °Following upper endoscopy (EGD, EUS, ERCP, esophageal dilation) °Vomiting of blood or coffee ground material  °New, significant abdominal pain  °New, significant chest pain or pain under the shoulder blades  °Painful or persistently difficult swallowing  °New shortness of breath  °Black, tarry-looking or red, bloody stools ° °FOLLOW UP:  °If any biopsies were taken you will be contacted by phone or by letter within the next 1-3  weeks. Call [phone]  if you have not heard about the biopsies in 3 weeks.  °Please also call with any specific questions about appointments or follow up tests. ° °

## 2020-02-07 NOTE — Anesthesia Preprocedure Evaluation (Addendum)
Anesthesia Evaluation  Patient identified by MRN, date of birth, ID band Patient awake    Reviewed: Allergy & Precautions, NPO status , Patient's Chart, lab work & pertinent test results  History of Anesthesia Complications (+) history of anesthetic complications (post op headaches)  Airway Mallampati: II  TM Distance: >3 FB Neck ROM: Full    Dental  (+) Dental Advisory Given, Teeth Intact   Pulmonary neg pulmonary ROS,    Pulmonary exam normal        Cardiovascular hypertension ("white coat HTN"), Normal cardiovascular exam     Neuro/Psych negative neurological ROS  negative psych ROS   GI/Hepatic (+)     substance abuse  alcohol use,  Esophageal stenosis    Endo/Other   Graves disease   Renal/GU negative Renal ROS     Musculoskeletal negative musculoskeletal ROS (+)   Abdominal   Peds  Hematology negative hematology ROS (+)   Anesthesia Other Findings Covid neg 02/03/20   Reproductive/Obstetrics                            Anesthesia Physical Anesthesia Plan  ASA: II  Anesthesia Plan: MAC   Post-op Pain Management:    Induction: Intravenous  PONV Risk Score and Plan: 2 and Propofol infusion and Treatment may vary due to age or medical condition  Airway Management Planned: Nasal Cannula and Natural Airway  Additional Equipment: None  Intra-op Plan:   Post-operative Plan:   Informed Consent: I have reviewed the patients History and Physical, chart, labs and discussed the procedure including the risks, benefits and alternatives for the proposed anesthesia with the patient or authorized representative who has indicated his/her understanding and acceptance.       Plan Discussed with: CRNA and Anesthesiologist  Anesthesia Plan Comments:        Anesthesia Quick Evaluation

## 2020-02-07 NOTE — Anesthesia Postprocedure Evaluation (Signed)
Anesthesia Post Note  Patient: Crystal [name]  Procedure(s) Performed: ESOPHAGOGASTRODUODENOSCOPY (EGD) WITH PROPOFOL (N/A ) SAVORY DILATION (N/A )     Patient location during evaluation: PACU Anesthesia Type: MAC Level of consciousness: awake and alert Pain management: pain level controlled Vital Signs Assessment: post-procedure vital signs reviewed and stable Respiratory status: spontaneous breathing, nonlabored ventilation and respiratory function stable Cardiovascular status: stable and blood pressure returned to baseline Anesthetic complications: no    Last Vitals:  Vitals:   02/07/20 1050 02/07/20 1100  BP: (!) 160/76 (!) 172/78  Pulse: 60 (!) 57  Resp: 16 16  Temp:    SpO2: 100% 99%    Last Pain:  Vitals:   02/07/20 1100  TempSrc:   PainSc: [place]

## 2020-02-09 ENCOUNTER — Encounter: Payer: Self-pay | Admitting: *Deleted

## 2020-04-26 ENCOUNTER — Other Ambulatory Visit: Payer: Self-pay | Admitting: Family Medicine

## 2020-04-26 DIAGNOSIS — Z1231 Encounter for screening mammogram for malignant neoplasm of breast: Secondary | ICD-10-CM

## 2020-05-18 ENCOUNTER — Other Ambulatory Visit: Payer: Self-pay

## 2020-05-18 ENCOUNTER — Ambulatory Visit
Admission: RE | Admit: 2020-05-18 | Discharge: 2020-05-18 | Disposition: A | Discharge status: Home / Self Care | Payer: 59 | Source: Ambulatory Visit | Attending: Family Medicine | Admitting: Family Medicine

## 2020-05-18 DIAGNOSIS — Z1231 Encounter for screening mammogram for malignant neoplasm of breast: Secondary | ICD-10-CM

## 2020-05-23 ENCOUNTER — Encounter: Payer: Self-pay | Admitting: Internal Medicine

## 2020-05-23 ENCOUNTER — Ambulatory Visit (INDEPENDENT_AMBULATORY_CARE_PROVIDER_SITE_OTHER): Payer: 59 | Admitting: Internal Medicine

## 2020-05-23 VITALS — BP 128/76 | HR 70 | Ht 62.0 in | Wt 103.0 lb

## 2020-05-23 DIAGNOSIS — K219 Gastro-esophageal reflux disease without esophagitis: Secondary | ICD-10-CM

## 2020-05-23 DIAGNOSIS — K222 Esophageal obstruction: Secondary | ICD-10-CM | POA: Diagnosis not present

## 2020-05-23 DIAGNOSIS — R1310 Dysphagia, unspecified: Secondary | ICD-10-CM

## 2020-05-23 MED ORDER — OMEPRAZOLE 40 MG PO CPDR: 40.0000 mg | DELAYED_RELEASE_CAPSULE | Freq: Every day | ORAL | 3 refills | Status: DC

## 2020-05-23 NOTE — Progress Notes (Signed)
HISTORY OF PRESENT ILLNESS:  Crystal [name] is a 64 y.o. female who presents today for follow-up regarding management of GERD and complicated esophageal stricture requiring multiple dilations.  Patient had previous food impaction and was found to have a high-grade esophageal stricture.  Thereafter she underwent multiple upper endoscopies with esophageal dilation over the course of several months.  Her last upper endoscopy on February 07, 2020 revealed multiple benign appearing intrinsic rings or strictures with the narrowest measuring about 13 mm.  This was located proximally at about 20 cm from the incisors.  Savary dilation to a maximal diameter of 17 mm was carried out.  She was also noted to have a large distal esophageal stricture with associated esophagitis.  At the time of her last procedure her omeprazole was increased from 20 mg daily to 40 mg daily.  She continues on that dose and follows up at this time as requested.  The patient is pleased to report that she has had no swallowing difficulties.  She is eating items such as hamburgers and wraps without difficulty.  She is having no heartburn or indigestion.  Tolerating her medication well.  She has completed her Covid vaccination series  REVIEW OF SYSTEMS:  All non-GI ROS negative entirely unless otherwise stated in the HPI  Past Medical History:  Diagnosis Date  . Complication of anesthesia    kept a severe HA for 10 days after    . Esophageal stenosis   . Graves' disease 1985   treated with PTU  . Hyperlipidemia   . Insomnia   . Osteoporosis   . Wears glasses   . White coat hypertension     Past Surgical History:  Procedure Laterality Date  . BIOPSY  10/11/2019   Procedure: BIOPSY;  Surgeon: Gatha Mayer, MD;  Location: [facility];  Service: Endoscopy;;  . COLONOSCOPY    . ESOPHAGEAL DILATION  10/11/2019   Procedure: ESOPHAGEAL DILATION;  Surgeon: Gatha Mayer, MD;  Location: [facility];  Service: Endoscopy;;  .  ESOPHAGOGASTRODUODENOSCOPY N/A 10/11/2019   Procedure: ESOPHAGOGASTRODUODENOSCOPY (EGD);  Surgeon: Gatha Mayer, MD;  Location: [facility];  Service: Endoscopy;  Laterality: N/A;  . ESOPHAGOGASTRODUODENOSCOPY (EGD) WITH PROPOFOL N/A 11/15/2019   Procedure: ESOPHAGOGASTRODUODENOSCOPY (EGD) WITH PROPOFOL With savary dilation, need fluroscopy;  Surgeon: Irene Shipper, MD;  Location: [facility];  Service: Endoscopy;  Laterality: N/A;  . ESOPHAGOGASTRODUODENOSCOPY (EGD) WITH PROPOFOL N/A 12/27/2019   Procedure: ESOPHAGOGASTRODUODENOSCOPY (EGD) WITH PROPOFOL;  Surgeon: Irene Shipper, MD;  Location: [facility];  Service: Endoscopy;  Laterality: N/A;  . ESOPHAGOGASTRODUODENOSCOPY (EGD) WITH PROPOFOL N/A 02/07/2020   Procedure: ESOPHAGOGASTRODUODENOSCOPY (EGD) WITH PROPOFOL;  Surgeon: Irene Shipper, MD;  Location: [facility];  Service: Endoscopy;  Laterality: N/A;  . FOREIGN BODY REMOVAL  10/11/2019   Procedure: FOREIGN BODY REMOVAL;  Surgeon: Gatha Mayer, MD;  Location: [facility];  Service: Endoscopy;;  . Azzie Almas DILATION N/A 11/15/2019   Procedure: Azzie Almas DILATION;  Surgeon: Irene Shipper, MD;  Location: [facility];  Service: Endoscopy;  Laterality: N/A;  . SAVORY DILATION N/A 12/27/2019   Procedure: SAVORY DILATION;  Surgeon: Irene Shipper, MD;  Location: [facility];  Service: Endoscopy;  Laterality: N/A;  Needs Fluroscopy  . SAVORY DILATION N/A 02/07/2020   Procedure: SAVORY DILATION;  Surgeon: Irene Shipper, MD;  Location: [facility];  Service: Endoscopy;  Laterality: N/A;  Need Fluro    Social History Jazzalynn Miler  reports that she has never smoked. She has never used  smokeless tobacco. She reports current alcohol use of about 14.0 standard drinks of alcohol per week. She reports that she does not use drugs.  family history includes Atrial fibrillation in her sister; Dementia in her mother; Heart attack in her father; Heart disease in her father; Heart disease (age of onset: 65)  in her mother; Hypertension in her sister; Stroke in her sister.  Allergies  Allergen Reactions  . Adhesive [Tape] Swelling  . E-Mycin [Erythromycin Base] Swelling       PHYSICAL EXAMINATION: Vital signs: BP 128/76   Pulse 70   Ht 5\' 2"  (1.575 m)   Wt 103 lb (46.7 kg)   BMI 18.84 kg/m   Constitutional: generally well-appearing, no acute distress Psychiatric: alert and oriented x3, cooperative Eyes: extraocular movements intact, anicteric, conjunctiva pink Mouth: oral pharynx moist, no lesions Abdomen: Not reexamined Skin: no lesions on visible extremities Neuro: No focal deficits.   ASSESSMENT:  1.  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  Last dilation February 2021 to 17 mm.  Currently without dysphagia. 2.  Normal colonoscopy May 2012  PLAN:  1.  Continue omeprazole 40 mg daily.  Medication risks reviewed 2.  Prescription refilled 3.  Strictly advised to contact the office should she begin to experience ANY recurrent dysphagia, as she would benefit from esophageal dilation, understanding her anatomy. 4.  Otherwise, routine office follow-up 1 year 5.  Will be due for routine repeat screening colonoscopy around May 2022.  She is aware

## 2020-05-23 NOTE — Patient Instructions (Signed)
We have sent the following medications to your pharmacy for you to pick up at your convenience:  Omprazole  Please follow up in one year

## 2020-08-07 NOTE — Patient Instructions (Addendum)
Please stop by lab before you go If you have mychart- we will send your results within 3 business days of [place] receiving them.  If you do not have mychart- we will call you about results within 5 business days of [place] receiving them.  *please note we are currently using [facility] which has a longer processing time than [place] typically so labs may not come back as quickly as in the past *please also note that you will see labs on mychart as soon as they post. I will later go in and write notes on them- will say "notes from Dr. Yong Channel"  This is likely a contact dermatitis with something in the sports bra- options would be discontinuing wearing or could try hydrocortisone cream OTC when irritated and if not helping within a week or so- I could send in stronger triamcinolone cream  recommended cutting back to 1 glass of wine per evening  Health Maintenance Due  Topic Date Due  . INFLUENZA VACCINE - - will complete later in flu season (please let us know if you get this at another location so we can update your chart) . We should have vaccination here in 1-2 months - can call back for an appointment.   07/23/2020

## 2020-08-07 NOTE — Progress Notes (Signed)
Phone [phone]   Subjective:  Patient presents today for their annual physical. Chief complaint-noted.   See problem oriented charting- Review of Systems  Constitutional: Negative for chills and fever.  HENT: Negative for hearing loss and tinnitus.   Eyes: Negative for blurred vision and double vision.  Respiratory: Negative for cough, shortness of breath and wheezing.   Cardiovascular: Negative for chest pain and palpitations.  Gastrointestinal: Negative for blood in stool, heartburn, nausea and vomiting.  Genitourinary: Negative for dysuria, frequency and urgency.  Musculoskeletal: Negative for back pain, joint pain and neck pain.  Skin: Positive for rash.       On chest x 1 mo   Neurological: Negative for dizziness, seizures, weakness and headaches.  Endo/Heme/Allergies: Does not bruise/bleed easily.  Psychiatric/Behavioral: Negative for depression, memory loss and suicidal ideas. The patient does not have insomnia.     The following were reviewed and entered/updated in epic: Past Medical History:  Diagnosis Date  . Complication of anesthesia    kept a severe HA for 10 days after    . Esophageal stenosis   . Graves' disease 1985   treated with PTU  . Hyperlipidemia   . Insomnia   . Osteoporosis   . Wears glasses   . White coat hypertension    Patient Active Problem List   Diagnosis Date Noted  . Osteoporosis 07/26/2014    Priority: High  . Esophageal stricture     Priority: Medium  . Hyperlipidemia 01/15/2018    Priority: Medium  . Elevated blood pressure reading in office with white coat syndrome, without diagnosis of hypertension 07/26/2014    Priority: Medium  . Thyroid disease     Priority: Medium   Past Surgical History:  Procedure Laterality Date  . BIOPSY  10/11/2019   Procedure: BIOPSY;  Surgeon: Gatha Mayer, MD;  Location: [facility];  Service: Endoscopy;;  . COLONOSCOPY    . ESOPHAGEAL DILATION  10/11/2019   Procedure: ESOPHAGEAL  DILATION;  Surgeon: Gatha Mayer, MD;  [facility];  Service: Endoscopy;;  . ESOPHAGOGASTRODUODENOSCOPY N/A 10/11/2019   Procedure: ESOPHAGOGASTRODUODENOSCOPY (EGD);  Surgeon: Gatha Mayer, MD;  Location: [facility];  Service: Endoscopy;  Laterality: N/A;  . ESOPHAGOGASTRODUODENOSCOPY (EGD) WITH PROPOFOL N/A 11/15/2019   Procedure: ESOPHAGOGASTRODUODENOSCOPY (EGD) WITH PROPOFOL With savary dilation, need fluroscopy;  Surgeon: Irene Shipper, MD;  [facility];  Service: Endoscopy;  Laterality: N/A;  . ESOPHAGOGASTRODUODENOSCOPY (EGD) WITH PROPOFOL N/A 12/27/2019   Procedure: ESOPHAGOGASTRODUODENOSCOPY (EGD) WITH PROPOFOL;  Surgeon: Irene Shipper, MD;  [facility];  Service: Endoscopy;  Laterality: N/A;  . ESOPHAGOGASTRODUODENOSCOPY (EGD) WITH PROPOFOL N/A 02/07/2020   Procedure: ESOPHAGOGASTRODUODENOSCOPY (EGD) WITH PROPOFOL;  Surgeon: Irene Shipper, MD;  [facility];  Service: Endoscopy;  Laterality: N/A;  . FOREIGN BODY REMOVAL  10/11/2019   Procedure: FOREIGN BODY REMOVAL;  Surgeon: Gatha Mayer, MD;  [facility];  Service: Endoscopy;;  . Azzie Almas DILATION N/A 11/15/2019   Procedure: Azzie Almas DILATION;  Surgeon: Irene Shipper, MD;  [facility];  Service: Endoscopy;  Laterality: N/A;  . SAVORY DILATION N/A 12/27/2019   Procedure: SAVORY DILATION;  Surgeon: Irene Shipper, MD;  [facility];  Service: Endoscopy;  Laterality: N/A;  Needs Fluroscopy  . SAVORY DILATION N/A 02/07/2020   Procedure: SAVORY DILATION;  Surgeon: Irene Shipper, MD;  Location: [facility];  Service: Endoscopy;  Laterality: N/A;  Need Fluro    Family History  Problem Relation Age of Onset  .  Dementia Mother   . Heart disease Mother 65       sudden cardiac---?? heart block  . Heart disease Father        CABG x2, apparently 66rd open heart surgery.   Marland Kitchen Heart attack Father   . Stroke Sister        age 64. healthy prior to this- [place]  .  Hypertension Sister   . Atrial fibrillation Sister   . Colon cancer Neg Hx   . Stomach cancer Neg Hx   . Rectal cancer Neg Hx   . Pancreatic cancer Neg Hx   . Esophageal cancer Neg Hx     Medications- reviewed and updated Current Outpatient Medications  Medication Sig Dispense Refill  . Calcium Carb-Cholecalciferol (CALCIUM 600+D3 PO) Take 1 tablet by mouth daily.    . Cyanocobalamin (VITAMIN B-12 PO) Place 1 mL under the tongue daily.     Marland Kitchen omeprazole (PRILOSEC) 40 MG capsule Take 1 capsule (40 mg total) by mouth daily. 90 capsule 3   No current facility-administered medications for this visit.    Allergies-reviewed and updated Allergies  Allergen Reactions  . Adhesive [Tape] Swelling  . E-Mycin [Erythromycin Base] Swelling    Social History   Social History Narrative   Married for 26 years. No kids. 4 cats (lost one 06/2014, another in 2016). 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 She is starting to wind down late 2018      Socializes way too little reportedly   Loves to exercise, [place] [employer] [place] boating, has place in [place]. Thinking about retirement by age 66.   Eats healthy-kale, spinach beans   Objective  Objective:  BP 138/90   Pulse 83   Temp 98.4 F (36.9 C) (Temporal)   Ht 5\' 3"  (1.6 m)   Wt 108 lb (49 kg)   SpO2 99%   BMI 19.13 kg/m  Gen: NAD, resting comfortably HEENT: Mucous membranes are moist. Oropharynx normal Breasts: normal appearance, no masses or tenderness Neck: no thyromegaly CV: RRR no murmurs rubs or gallops Lungs: CTAB no crackles, wheeze, rhonchi Abdomen: soft/nontender/nondistended/normal bowel sounds. No rebound or guarding.  Ext: no edema Skin: warm, dry Neuro: grossly normal, moves all extremities, PERRLA   Assessment and Plan    64 y.o. female presenting for annual physical.  Health Maintenance counseling: 1. Anticipatory guidance: Patient counseled regarding regular dental exams -q6 months, eye exams - every other yearly,  avoiding smoking and second hand smoke , limiting alcohol to 1 beverage per day - 3 glasses of wine per day- recommended cutting back to 1 glass of wine per evening 2. Risk factor reduction:  Advised patient of need for regular exercise and diet rich and fruits and vegetables to reduce risk of heart attack and stroke. Exercise-  continues weight bearing exercise- 5-8 miles a day walking. Diet- very healthy diet- have discussed avoiding weight loss. Did lose weight from stricture- had to do puree diet- much better now  Wt Readings from Last 3 Encounters:  08/14/20 108 lb (49 kg)  05/23/20 103 lb (46.7 kg)  02/07/20 97 lb (44 kg)  3. Immunizations/screenings/ancillary studies-recommended flu shot in the fall  Immunization History  Administered Date(s) Administered  . Influenza Split 10/21/2011, 10/26/2012  .  Influenza,inj,Quad PF,6+ Mos 09/29/2013, 09/06/2014, 10/24/2015, 09/24/2016, 10/07/2017, 10/01/2018, 09/07/2019  . Moderna SARS-COVID-2 Vaccination 03/08/2020, 04/05/2020  . Tdap 04/20/2012  . Zoster Recombinat (Shingrix) 12/17/2018, 03/02/2019  4. Cervical cancer screening- normal Pap smear January 2019 with HPV negative-no further testing is required as will be over 65 with next needed 5. Breast cancer screening-  breast exam today-normal and mammogram 05/18/20 6. Colon cancer screening - May 2012 with 10-year repeat planned 7. Skin cancer screening-dermatology yearly with Dr. Vernell Barrier regular sunscreen use. Denies worrisome, changing, or new skin lesions.  8. Birth control/STD check- postmenopausal/monogamous-declines universal HIV screening 9. Osteoporosis screening at 65-  Improved worst T score -2.4 in March 2020-technically have osteoporosis based on prior one-time elevation but has  worked on weightbearing exercise, calcium, diet, vitamin D.  Repeat again March 2022 or later- she will message me around this itme.  -Never smoker  Status of chronic or acute concerns   #Social update-continues to provide care for her father who is in assisted living last year- now in [facility] care[facility], sister after massive stroke at 34- now at [facility] . She is Handling reasonably well  #Rash S:irritation  Where bra strap/sports bra comes across her chest. Improves without sports bra ROS-not ill appearing, no fever/chills. No new medications. Not immunocompromised. No mucus membrane involvement.  A/P: This is likely a contact dermatitis with something in the sports bra- options would be discontinuing wearing or could try hydrocortisone cream OTC when irritated and if not helping within a week or so- I could send in stronger triamcinolone cream  #esophageal stricture- emergency dilation last year and has had several. She is on high dose PPI omeprazole 40mg . Not great for osteoporosis bu thas to be on this.   # B12 deficiency S: Current treatment/medication (oral vs. IM):  Taking b12 orally  Lab Results  Component Value Date   VITAMINB12 206 (L) 12/09/2019  A/P: update b12 level with labs   #hyperlipidemia S: Medication:no medication  Lab Results  Component Value Date   CHOL 265 (H) 08/09/2019   HDL 96.30 08/09/2019   LDLCALC 157 (H) 08/09/2019   LDLDIRECT 140.1 04/13/2012   TRIG 62.0 08/09/2019   CHOLHDL 3 08/09/2019   A/P: 10-year ASCVD risk only 5.2%-low threshold to consider statin therapy-thankful for high HDL. Update lipid panel today   #Thyroid disease-history of Graves' disease treated with PTU in the past.  No current prescription.  Update TSH with labs today  #Insomnia-sparing amitriptyline for stress/anxiety/sleep. Has some on hand but not having to use  #Whitecoat hypertension. At home readings over last 10 averaging reasonable  #s- #s improved since retiring 133.9 79.5   Recommended follow up: Return in about 1 year (around 08/14/2021) for physical or sooner if needed.  Lab/Order associations: fasting   ICD-10-CM   1. Preventative health care  Z00.00 CBC with Differential/Platelet    Comprehensive metabolic panel    Lipid panel    VITAMIN D 25 Hydroxy (Vit-D Deficiency, Fractures)  2. Hyperlipidemia, unspecified hyperlipidemia type  E78.5 CBC with Differential/Platelet    Comprehensive metabolic panel    Lipid panel  3. High vitamin D level  E67.3   4. B12 deficiency  E53.8   5. Osteoporosis, unspecified osteoporosis type, unspecified pathological fracture presence  M81.0 VITAMIN D 25 Hydroxy (Vit-D Deficiency, Fractures)  6. Thyroid disease  E07.9   7. Screening for HIV (human immunodeficiency virus)  Z11.4    Return precautions advised.  Garret Reddish, MD

## 2020-08-14 ENCOUNTER — Other Ambulatory Visit: Payer: Self-pay

## 2020-08-14 ENCOUNTER — Ambulatory Visit (INDEPENDENT_AMBULATORY_CARE_PROVIDER_SITE_OTHER): Payer: 59 | Admitting: Family Medicine

## 2020-08-14 ENCOUNTER — Encounter: Payer: Self-pay | Admitting: Family Medicine

## 2020-08-14 VITALS — BP 138/90 | HR 83 | Temp 98.4°F | Ht 63.0 in | Wt 108.0 lb

## 2020-08-14 DIAGNOSIS — E673 Hypervitaminosis D: Secondary | ICD-10-CM

## 2020-08-14 DIAGNOSIS — E079 Disorder of thyroid, unspecified: Secondary | ICD-10-CM

## 2020-08-14 DIAGNOSIS — E538 Deficiency of other specified B group vitamins: Secondary | ICD-10-CM

## 2020-08-14 DIAGNOSIS — Z0000 Encounter for general adult medical examination without abnormal findings: Secondary | ICD-10-CM

## 2020-08-14 DIAGNOSIS — E785 Hyperlipidemia, unspecified: Secondary | ICD-10-CM | POA: Diagnosis not present

## 2020-08-14 DIAGNOSIS — M810 Age-related osteoporosis without current pathological fracture: Secondary | ICD-10-CM

## 2020-08-14 DIAGNOSIS — Z114 Encounter for screening for human immunodeficiency virus [HIV]: Secondary | ICD-10-CM

## 2020-08-14 NOTE — Addendum Note (Signed)
Addended by: Liliane Channel on: 08/14/2020 11:25 AM   Modules accepted: Orders

## 2020-08-15 LAB — LIPID PANEL
Cholesterol: 305 mg/dL — ABNORMAL HIGH (ref <200)
HDL: 118 mg/dL (ref >=50)
LDL Cholesterol (Calc): 171 mg/dL (calc) — ABNORMAL HIGH
Non-HDL Cholesterol (Calc): 187 mg/dL (calc) — ABNORMAL HIGH (ref <130)
Total CHOL/HDL Ratio: 2.6 (calc) (ref <5.0)
Triglycerides: 64 mg/dL (ref <150)

## 2020-08-15 LAB — COMPREHENSIVE METABOLIC PANEL
AG Ratio: 1.6 (calc) (ref 1.0–2.5)
ALT: 14 U/L (ref 6–29)
AST: 27 U/L (ref 10–35)
Albumin: 4.7 g/dL (ref 3.6–5.1)
Alkaline phosphatase (APISO): 65 U/L (ref 37–153)
BUN: 17 mg/dL (ref 7–25)
CO2: 29 mmol/L (ref 20–32)
Calcium: 9.7 mg/dL (ref 8.6–10.4)
Chloride: 98 mmol/L (ref 98–110)
Creat: 0.7 mg/dL (ref 0.50–0.99)
Globulin: 2.9 g/dL (calc) (ref 1.9–3.7)
Glucose, Bld: 106 mg/dL — ABNORMAL HIGH (ref 65–99)
Potassium: 4.7 mmol/L (ref 3.5–5.3)
Sodium: 137 mmol/L (ref 135–146)
Total Bilirubin: 0.4 mg/dL (ref 0.2–1.2)
Total Protein: 7.6 g/dL (ref 6.1–8.1)

## 2020-08-15 LAB — CBC WITH DIFFERENTIAL/PLATELET
Absolute Monocytes: 1047 cells/uL — ABNORMAL HIGH (ref 200–950)
Basophils Absolute: 82 cells/uL (ref 0–200)
Basophils Relative: 1.2 %
Eosinophils Absolute: 177 cells/uL (ref 15–500)
Eosinophils Relative: 2.6 %
HCT: 38.5 % (ref 35.0–45.0)
Hemoglobin: 13.2 g/dL (ref 11.7–15.5)
Lymphs Abs: 1707 cells/uL (ref 850–3900)
MCH: 32.9 pg (ref 27.0–33.0)
MCHC: 34.3 g/dL (ref 32.0–36.0)
MCV: 96 fL (ref 80.0–100.0)
MPV: 12.6 fL — ABNORMAL HIGH (ref 7.5–12.5)
Monocytes Relative: 15.4 %
Neutro Abs: 3788 cells/uL (ref 1500–7800)
Neutrophils Relative %: 55.7 %
Platelets: 255 10*3/uL (ref 140–400)
RBC: 4.01 10*6/uL (ref 3.80–5.10)
RDW: 12.2 % (ref 11.0–15.0)
Total Lymphocyte: 25.1 %
WBC: 6.8 10*3/uL (ref 3.8–10.8)

## 2020-08-15 LAB — TSH: TSH: 2.22 mIU/L (ref 0.40–4.50)

## 2020-08-15 LAB — VITAMIN D 25 HYDROXY (VIT D DEFICIENCY, FRACTURES): Vit D, 25-Hydroxy: 47 ng/mL (ref 30–100)

## 2020-08-15 LAB — VITAMIN B12: Vitamin B-12: 1180 pg/mL — ABNORMAL HIGH (ref 200–1100)

## 2020-09-04 ENCOUNTER — Encounter: Payer: Self-pay | Admitting: Family Medicine

## 2020-09-05 MED ORDER — TRIAMCINOLONE ACETONIDE 0.1 % EX CREA: 1.0000 "application " | TOPICAL_CREAM | Freq: Two times a day (BID) | CUTANEOUS | 0 refills | Status: DC

## 2020-10-03 ENCOUNTER — Other Ambulatory Visit: Payer: Self-pay

## 2020-10-03 ENCOUNTER — Ambulatory Visit (INDEPENDENT_AMBULATORY_CARE_PROVIDER_SITE_OTHER): Payer: 59

## 2020-10-03 ENCOUNTER — Encounter: Payer: Self-pay | Admitting: Family Medicine

## 2020-10-03 DIAGNOSIS — Z23 Encounter for immunization: Secondary | ICD-10-CM | POA: Diagnosis not present

## 2021-04-17 ENCOUNTER — Other Ambulatory Visit: Payer: Self-pay

## 2021-04-17 ENCOUNTER — Encounter: Payer: Self-pay | Admitting: Family Medicine

## 2021-04-17 DIAGNOSIS — Z1231 Encounter for screening mammogram for malignant neoplasm of breast: Secondary | ICD-10-CM

## 2021-04-18 ENCOUNTER — Other Ambulatory Visit: Payer: Self-pay

## 2021-04-18 DIAGNOSIS — Z1231 Encounter for screening mammogram for malignant neoplasm of breast: Secondary | ICD-10-CM

## 2021-04-19 ENCOUNTER — Other Ambulatory Visit: Payer: Self-pay | Admitting: Family Medicine

## 2021-04-19 DIAGNOSIS — M8580 Other specified disorders of bone density and structure, unspecified site: Secondary | ICD-10-CM

## 2021-05-08 ENCOUNTER — Encounter: Payer: Self-pay | Admitting: Internal Medicine

## 2021-05-18 ENCOUNTER — Other Ambulatory Visit: Payer: Self-pay

## 2021-05-18 ENCOUNTER — Encounter: Payer: Self-pay | Admitting: Family Medicine

## 2021-05-18 ENCOUNTER — Ambulatory Visit (INDEPENDENT_AMBULATORY_CARE_PROVIDER_SITE_OTHER): Payer: Medicare Other | Admitting: Family Medicine

## 2021-05-18 VITALS — BP 160/90 | HR 85 | Temp 98.4°F | Resp 17 | Ht 63.0 in | Wt 108.8 lb

## 2021-05-18 DIAGNOSIS — R030 Elevated blood-pressure reading, without diagnosis of hypertension: Secondary | ICD-10-CM | POA: Diagnosis not present

## 2021-05-18 DIAGNOSIS — L608 Other nail disorders: Secondary | ICD-10-CM

## 2021-05-18 DIAGNOSIS — R350 Frequency of micturition: Secondary | ICD-10-CM | POA: Diagnosis not present

## 2021-05-18 LAB — POCT URINALYSIS DIPSTICK
Bilirubin, UA: NEGATIVE
Blood, UA: NEGATIVE
Glucose, UA: NEGATIVE
Ketones, UA: NEGATIVE
Leukocytes, UA: NEGATIVE
Nitrite, UA: NEGATIVE
Protein, UA: POSITIVE — AB
Spec Grav, UA: 1.01 (ref 1.010–1.025)
Urobilinogen, UA: 0.2 E.U./dL
pH, UA: 6 (ref 5.0–8.0)

## 2021-05-18 MED ORDER — CEPHALEXIN 250 MG/5ML PO SUSR: 500.0000 mg | Freq: Two times a day (BID) | ORAL | 0 refills | Status: DC

## 2021-05-18 NOTE — Addendum Note (Signed)
Addended by: Eddie North C on: 05/18/2021 01:10 PM   Modules accepted: Orders

## 2021-05-18 NOTE — Progress Notes (Signed)
   Subjective:    Patient ID: Crystal [name], female    DOB: Sep 01, 1956, 65 y.o.   MRN: [id number]  HPI ? UTI- Tuesday developed discomfort in her abdomen w/ increased urinary frequency.  Mild LBP.  No burning w/ urination.  Denies urgency.  Normal BMs.  No blood in urine.  No change in odor or appearance.  No hx of UTI.  No N/V.  abd is not TTP.  + suprapubic pressure.  Nail discoloration- 3 fingers on L hand 'turned orange' on Saturday but have since cleared  Elevated BP- pt reports BP is 'always' elevated 'in a doctor's office'.  'all my life'.  No CP, SOB, HAs, visual changes, edema.  Pt states she is very anxious today   Review of Systems For ROS see HPI   This visit occurred during the SARS-CoV-2 public health emergency.  Safety protocols were in place, including screening questions prior to the visit, additional usage of staff PPE, and extensive cleaning of exam room while observing appropriate contact time as indicated for disinfecting solutions.       Objective:   Physical Exam Vitals reviewed.  Constitutional:      General: She is not in acute distress.    Appearance: Normal appearance. She is well-developed. She is not ill-appearing.  HENT:     Head: Normocephalic and atraumatic.  Cardiovascular:     Rate and Rhythm: Normal rate and regular rhythm.     Pulses: Normal pulses.     Heart sounds: Normal heart sounds.  Pulmonary:     Effort: Pulmonary effort is normal.     Breath sounds: Normal breath sounds.  Abdominal:     General: There is distension (mild suprapubic and lower abdominal distension).     Palpations: Abdomen is soft.     Tenderness: There is no abdominal tenderness (no suprapubic or CVA tenderness).  Skin:    General: Skin is warm and dry.     Comments: Nails WNL  Neurological:     General: No focal deficit present.     Mental Status: She is alert and oriented to person, place, and time.  Psychiatric:        Mood and Affect: Mood normal.         Behavior: Behavior normal.        Thought Content: Thought content normal.           Assessment & Plan:  Urinary frequency- new.  Combined w/ suprapubic pressure and LBP this is suspicious for UTI despite essentially normal UA.  Urine was cloudy and foul smelling so we will send for cx.  In the meantime, start Keflex BID x5 days.  Pt expressed understanding and is in agreement w/ plan.   Nail discoloration- pt reports they were orange on Saturday but have since returned to normal.  Encouraged her to take a picture if this happens again so we can look for possible causes.  Elevated BP- pt states she has a hx of white coat HTN 'since I was about 20'.  Is currently asymptomatic and does not seem concerned about her elevated BP.  Encouraged her to f/u w/ PCP regarding this issue.  Pt expressed understanding and is in agreement w/ plan.

## 2021-05-18 NOTE — Patient Instructions (Signed)
Please schedule an appt w/ Dr Yong Channel to recheck blood pressure We'll notify you of your urine culture results START the Cephalexin as directed- take w/ food Drink plenty of fluids Call with any questions or concerns Enjoy your weekend!

## 2021-05-20 LAB — URINE CULTURE
MICRO NUMBER:: 11945499
SPECIMEN QUALITY:: ADEQUATE

## 2021-05-22 ENCOUNTER — Encounter: Payer: Self-pay | Admitting: Family Medicine

## 2021-06-09 ENCOUNTER — Other Ambulatory Visit: Payer: Self-pay | Admitting: Internal Medicine

## 2021-06-11 ENCOUNTER — Ambulatory Visit: Payer: 59

## 2021-06-18 ENCOUNTER — Ambulatory Visit
Admission: RE | Admit: 2021-06-18 | Discharge: 2021-06-18 | Disposition: A | Discharge status: Home / Self Care | Payer: 59 | Source: Ambulatory Visit | Attending: Family Medicine | Admitting: Family Medicine

## 2021-06-18 ENCOUNTER — Other Ambulatory Visit: Payer: Self-pay

## 2021-06-18 DIAGNOSIS — Z1231 Encounter for screening mammogram for malignant neoplasm of breast: Secondary | ICD-10-CM

## 2021-06-28 ENCOUNTER — Encounter: Payer: Self-pay | Admitting: Internal Medicine

## 2021-07-12 ENCOUNTER — Encounter: Payer: Self-pay | Admitting: Internal Medicine

## 2021-07-12 ENCOUNTER — Ambulatory Visit: Payer: Medicare Other | Admitting: Internal Medicine

## 2021-07-12 VITALS — BP 144/80 | HR 72 | Ht 61.5 in | Wt 107.0 lb

## 2021-07-12 DIAGNOSIS — K219 Gastro-esophageal reflux disease without esophagitis: Secondary | ICD-10-CM | POA: Diagnosis not present

## 2021-07-12 DIAGNOSIS — K222 Esophageal obstruction: Secondary | ICD-10-CM | POA: Diagnosis not present

## 2021-07-12 DIAGNOSIS — Z1211 Encounter for screening for malignant neoplasm of colon: Secondary | ICD-10-CM

## 2021-07-12 DIAGNOSIS — R1310 Dysphagia, unspecified: Secondary | ICD-10-CM | POA: Diagnosis not present

## 2021-07-12 MED ORDER — OMEPRAZOLE 40 MG PO CPDR: 40.0000 mg | DELAYED_RELEASE_CAPSULE | Freq: Every day | ORAL | 3 refills | Status: DC

## 2021-07-12 NOTE — Progress Notes (Signed)
HISTORY OF PRESENT ILLNESS:  Crystal [name] is a 65 y.o. female with GERD complicated by high-grade esophageal stricturing which required multiple esophageal dilations.  Patient is also had a previous food impaction.  She has phenotypic EOE type esophagus.  Last esophageal dilation February 2021 up to 17 mm with Savary dilator.  She presents today for follow-up.  She was last seen in the office May 23, 2020.  At that time no dysphagia.  Patient presents today as requested.  She tells me that she has continued on omeprazole 40 mg daily.  No reflux symptoms.  She denies any significant dysphagia.  Her last colonoscopy was performed in 2012.  She is anticipating repeat screening colonoscopy this September.  GI review of systems is negative  REVIEW OF SYSTEMS:  All non-GI ROS negative unless otherwise stated in the HPI.  Past Medical History:  Diagnosis Date   Complication of anesthesia    kept a severe HA for 10 days after     Esophageal stenosis    Graves' disease 1985   treated with PTU   Hyperlipidemia    Insomnia    Osteoporosis    Wears glasses    White coat hypertension     Past Surgical History:  Procedure Laterality Date   BIOPSY  10/11/2019   Procedure: BIOPSY;  Surgeon: Gatha Mayer, MD;  [facility];  Service: Endoscopy;;   COLONOSCOPY     ESOPHAGEAL DILATION  10/11/2019   Procedure: ESOPHAGEAL DILATION;  Surgeon: Gatha Mayer, MD;  [facility];  Service: Endoscopy;;   ESOPHAGOGASTRODUODENOSCOPY N/A 10/11/2019   Procedure: ESOPHAGOGASTRODUODENOSCOPY (EGD);  Surgeon: Gatha Mayer, MD;  Location: [facility];  Service: Endoscopy;  Laterality: N/A;   ESOPHAGOGASTRODUODENOSCOPY (EGD) WITH PROPOFOL N/A 11/15/2019   Procedure: ESOPHAGOGASTRODUODENOSCOPY (EGD) WITH PROPOFOL With savary dilation, need fluroscopy;  Surgeon: Irene Shipper, MD;  [facility];  Service: Endoscopy;  Laterality: N/A;   ESOPHAGOGASTRODUODENOSCOPY (EGD) WITH  PROPOFOL N/A 12/27/2019   Procedure: ESOPHAGOGASTRODUODENOSCOPY (EGD) WITH PROPOFOL;  Surgeon: Irene Shipper, MD;  [facility];  Service: Endoscopy;  Laterality: N/A;   ESOPHAGOGASTRODUODENOSCOPY (EGD) WITH PROPOFOL N/A 02/07/2020   Procedure: ESOPHAGOGASTRODUODENOSCOPY (EGD) WITH PROPOFOL;  Surgeon: Irene Shipper, MD;  [facility];  Service: Endoscopy;  Laterality: N/A;   FOREIGN BODY REMOVAL  10/11/2019   Procedure: FOREIGN BODY REMOVAL;  Surgeon: Gatha Mayer, MD;  [facility];  Service: Endoscopy;;   SAVORY DILATION N/A 11/15/2019   Procedure: Azzie Almas DILATION;  Surgeon: Irene Shipper, MD;  [facility];  Service: Endoscopy;  Laterality: N/A;   SAVORY DILATION N/A 12/27/2019   Procedure: SAVORY DILATION;  Surgeon: Irene Shipper, MD;  [facility];  Service: Endoscopy;  Laterality: N/A;  Needs Fluroscopy   SAVORY DILATION N/A 02/07/2020   Procedure: SAVORY DILATION;  Surgeon: Irene Shipper, MD;  [facility];  Service: Endoscopy;  Laterality: N/A;  Need Fluro    Social History Crystal [name]  reports that she has never smoked. She has never used smokeless tobacco. She reports current alcohol use of about 14.0 standard drinks of alcohol per week. She reports that she does not use drugs.  family history includes Atrial fibrillation in her sister; Dementia in her mother; Heart attack in her father; Heart disease in her father; Heart disease (age of onset: 48) in her mother; Hypertension in her sister; Stroke in her sister; Stroke (age of onset: 0) in her father.  Allergies  Allergen Reactions   Adhesive [Tape] Swelling   E-Mycin [Erythromycin Base] Swelling       PHYSICAL EXAMINATION: Vital signs: BP (!) 144/80 (BP Location: Left Arm, Patient Position: Sitting, Cuff Size: Normal)   Pulse 72   Ht 5' 1.5" (1.562 m) Comment: height measured witout shoes  Wt 107 lb (48.5 kg)   BMI 19.89 kg/m   Constitutional: generally  well-appearing, no acute distress Psychiatric: alert and oriented x3, cooperative Eyes: extraocular movements intact, anicteric, conjunctiva pink Mouth: oral pharynx moist, no lesions Neck: supple no lymphadenopathy Cardiovascular: heart regular rate and rhythm, no murmur Lungs: clear to auscultation bilaterally Abdomen: soft, nontender, nondistended, no obvious ascites, no peritoneal signs, normal bowel sounds, no organomegaly Rectal: Deferred until colonoscopy Extremities: no clubbing, cyanosis, or lower extremity edema bilaterally Skin: no lesions on visible extremities Neuro: No focal deficits. No asterixis.     ASSESSMENT:  1.  GERD complicated by peptic stricture.  High-grade.  Multiple dilations.  EOE appearing esophagus.  She reports remaining asymptomatic post dilation on PPI. 2.  Normal colonoscopy May 2012.  Average risk for colorectal neoplasia   PLAN:  1.  Reflux precautions 2.  Continue omeprazole 40 mg daily.  Prescription refilled.  Medication risks reviewed.  In particular, she wishes to discuss effects on bone density, potentially.  Current knowledge regarding PPIs and their potential long-term side effects were reviewed 3.  Strictly advised to contact the office should she develop any significant recurrent dysphagia as she will require repeat esophageal dilation.  This of course to avoid recurrent food impaction. 4.  Screening colonoscopy.  Set for September 23.The nature of the procedure, as well as the risks, benefits, and alternatives were carefully and thoroughly reviewed with the patient. Ample time for discussion and questions allowed. The patient understood, was satisfied, and agreed to proceed.

## 2021-07-12 NOTE — Patient Instructions (Signed)
If you are age 65 or older, your body mass index should be between 23-30. Your Body mass index is 19.89 kg/m. If this is out of the aforementioned range listed, please consider follow up with your Primary Care Provider.  If you are age 16 or younger, your body mass index should be between 19-25. Your Body mass index is 19.89 kg/m. If this is out of the aformentioned range listed, please consider follow up with your Primary Care Provider.   __________________________________________________________  The [facility] providers would like to encourage you to use [facility] to communicate with providers for non-urgent requests or questions.  Due to long hold times on the telephone, sending your provider a message by [facility] may be a faster and more efficient way to get a response.  Please allow 48 business hours for a response.  Please remember that this is for non-urgent requests.   We have sent the following medications to your pharmacy for you to pick up at your convenience:  Omeprazole  You have been scheduled for an endoscopy. Please follow written instructions given to you at your visit today. If you use inhalers (even only as needed), please bring them with you on the day of your procedure.

## 2021-07-16 ENCOUNTER — Other Ambulatory Visit: Payer: Self-pay

## 2021-07-16 MED ORDER — PLENVU 140 G PO SOLR: ORAL | 0 refills | Status: DC

## 2021-08-01 IMAGING — MG MM DIGITAL SCREENING BILAT W/ TOMO AND CAD
6 of 10 series · 6 of 30 positions shown · non-contrast
Comparison: Previous exam(s).

CLINICAL DATA: Screening.

EXAM:
DIGITAL SCREENING BILATERAL MAMMOGRAM WITH TOMOSYNTHESIS AND CAD
TECHNIQUE: Bilateral screening digital craniocaudal and mediolateral oblique
mammograms were obtained. Bilateral screening digital breast
tomosynthesis was performed. The images were evaluated with
computer-aided detection.

[L CC synth-2D (1 of 2)]
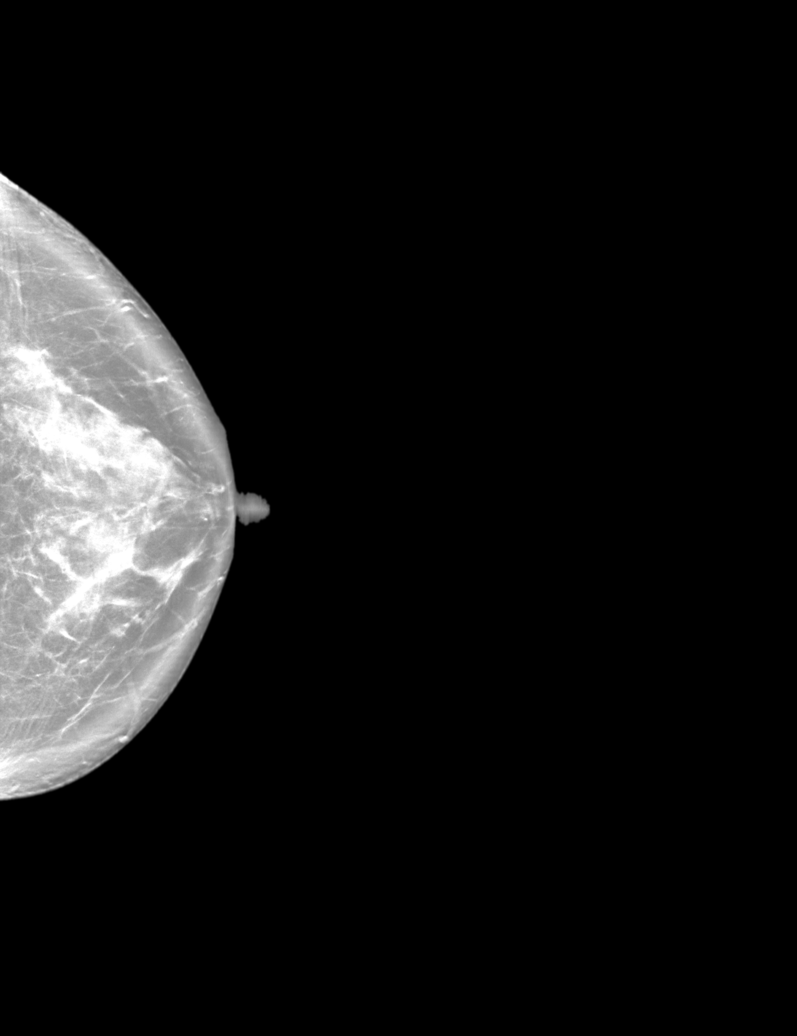

[L CC synth-2D (2 of 2)]
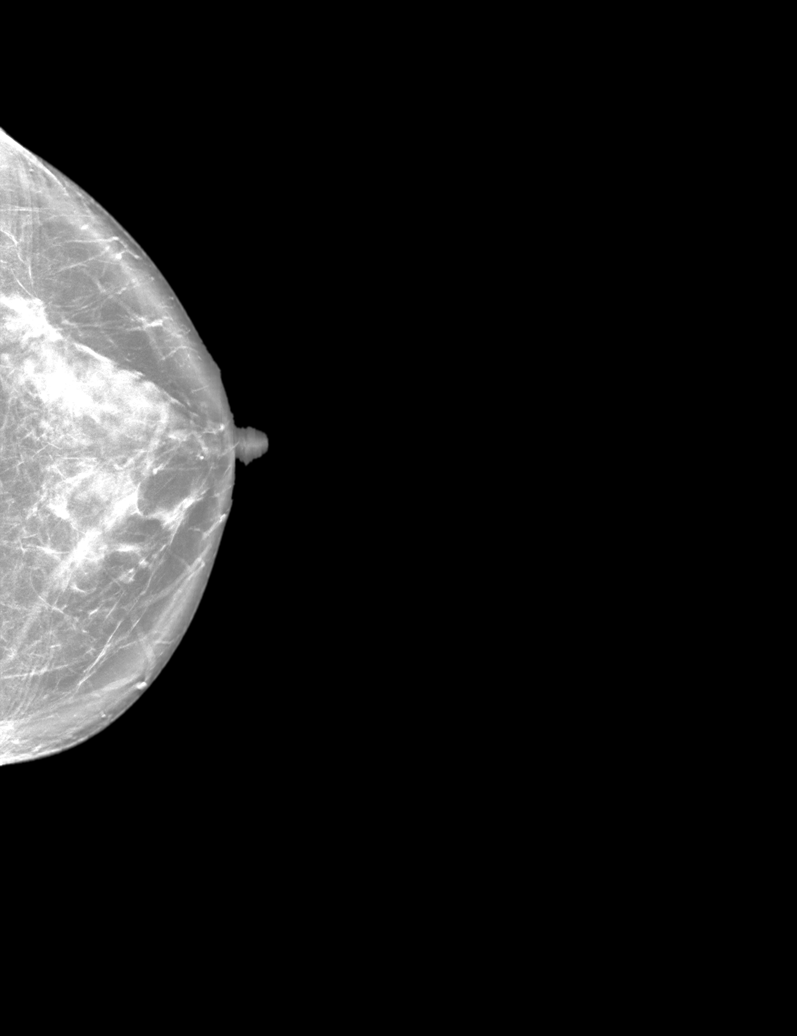

[R CC synth-2D]
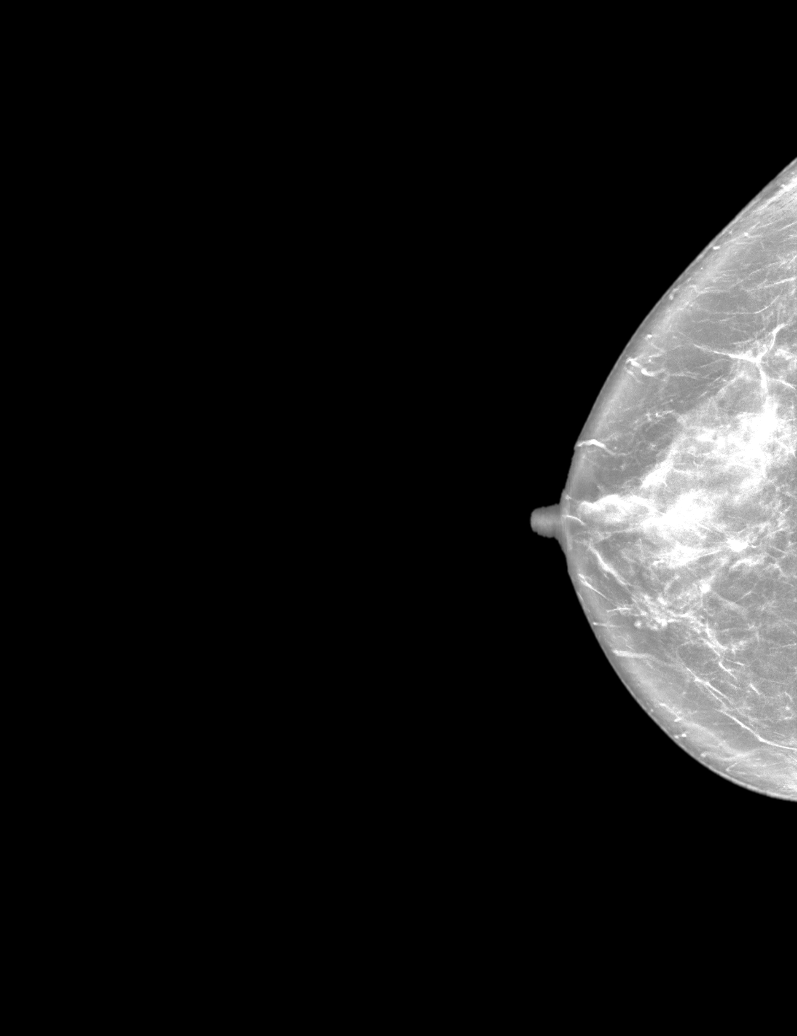

[R MLO synth-2D]
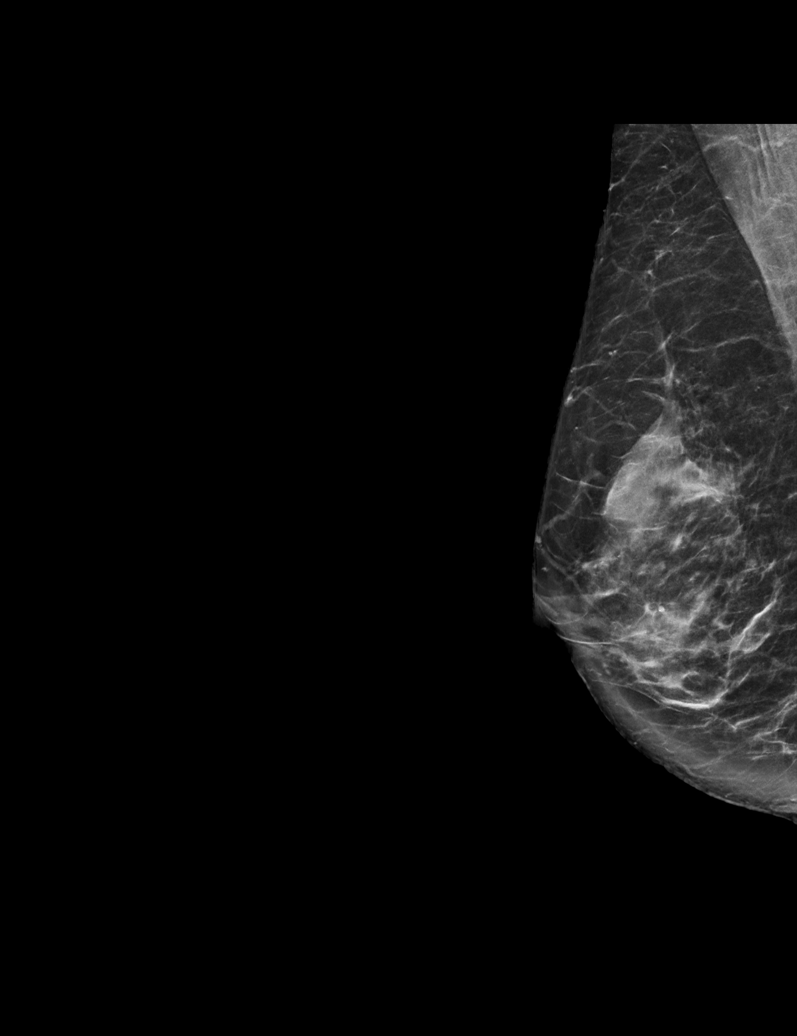

[L MLO synth-2D]
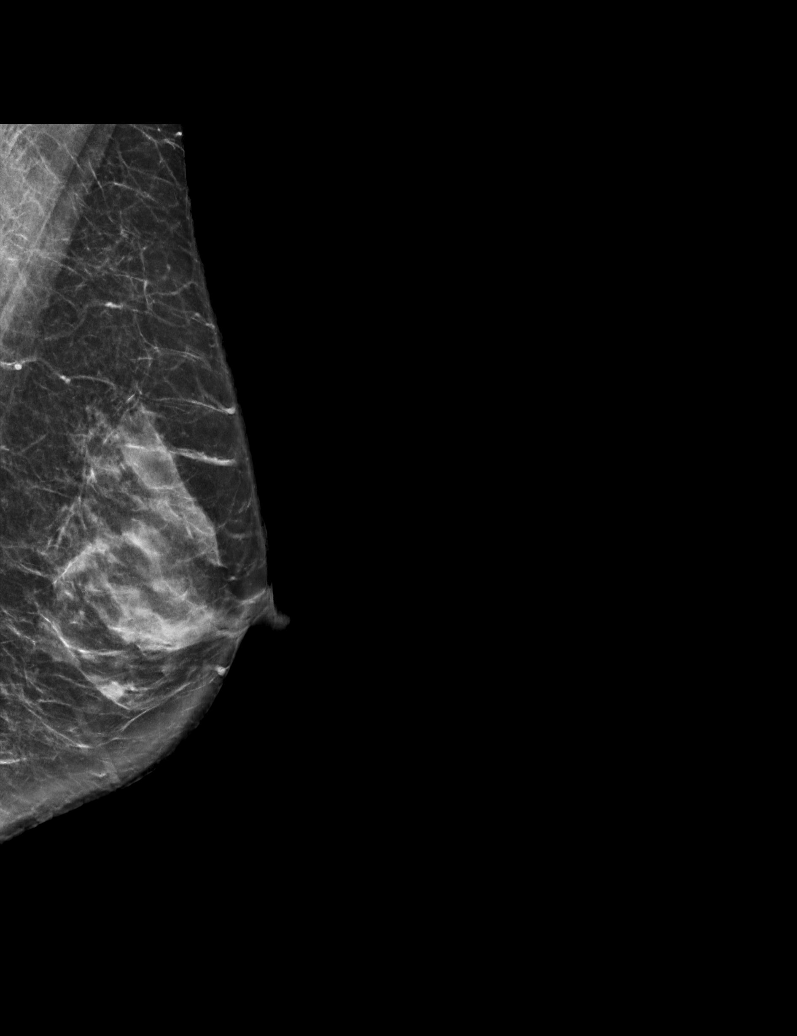

[R MLO tomo · tomo slice 35/69.0]
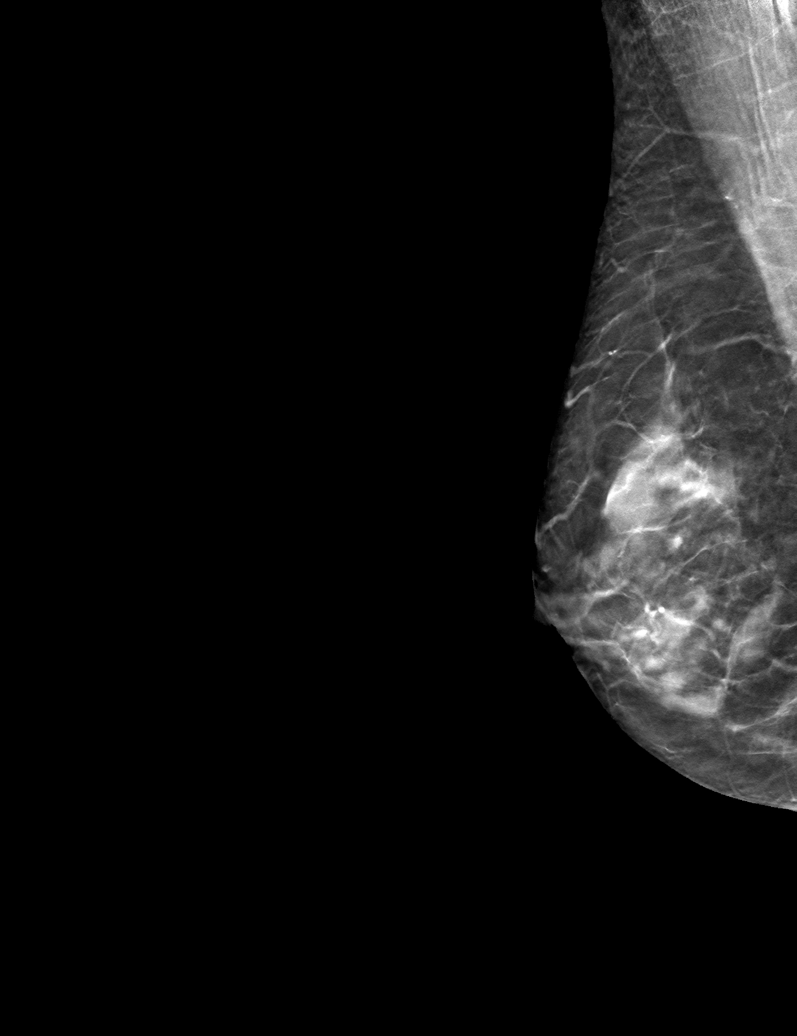

[6 of 30 positions shown; findings below may reference images not displayed]

ACR Breast Density Category c: The breast tissue is heterogeneously
dense, which may obscure small masses.
FINDINGS: There are no findings suspicious for malignancy.
IMPRESSION: No mammographic evidence of malignancy. A result letter of this
screening mammogram will be mailed directly to the patient.

RECOMMENDATION:
Screening mammogram in one year. (Code:Q3-W-BC3)

BI-RADS CATEGORY  1: Negative.

## 2021-08-17 NOTE — Progress Notes (Addendum)
Phone [phone]   Subjective:  Patient presents today for their annual physical. Chief complaint-noted.   See problem oriented charting- ROS- full  review of systems was completed and negative except for: rare backpain if playing a lot of golf  The following were reviewed and entered/updated in epic: Past Medical History:  Diagnosis Date   Complication of anesthesia    kept a severe HA for 10 days after     Esophageal stenosis    Graves' disease 1985   treated with PTU   Hyperlipidemia    Insomnia    Osteoporosis    Wears glasses    White coat hypertension    Patient Active Problem List   Diagnosis Date Noted   Osteoporosis 07/26/2014    Priority: High   Esophageal stricture     Priority: Medium   Hyperlipidemia 01/15/2018    Priority: Medium   Elevated blood pressure reading in office with white coat syndrome, without diagnosis of hypertension 07/26/2014    Priority: Medium   Thyroid disease     Priority: Medium   Past Surgical History:  Procedure Laterality Date   BIOPSY  10/11/2019   Procedure: BIOPSY;  Surgeon: Gatha Mayer, MD;  Location: [facility];  Service: Endoscopy;;   COLONOSCOPY     ESOPHAGEAL DILATION  10/11/2019   Procedure: ESOPHAGEAL DILATION;  Surgeon: Gatha Mayer, MD;  Location: [facility];  Service: Endoscopy;;   ESOPHAGOGASTRODUODENOSCOPY N/A 10/11/2019   Procedure: ESOPHAGOGASTRODUODENOSCOPY (EGD);  Surgeon: Gatha Mayer, MD;  Location: [facility];  Service: Endoscopy;  Laterality: N/A;   ESOPHAGOGASTRODUODENOSCOPY (EGD) WITH PROPOFOL N/A 11/15/2019   Procedure: ESOPHAGOGASTRODUODENOSCOPY (EGD) WITH PROPOFOL With savary dilation, need fluroscopy;  Surgeon: Irene Shipper, MD;  Location: [facility];  Service: Endoscopy;  Laterality: N/A;   ESOPHAGOGASTRODUODENOSCOPY (EGD) WITH PROPOFOL N/A 12/27/2019   Procedure: ESOPHAGOGASTRODUODENOSCOPY (EGD) WITH PROPOFOL;  Surgeon: Irene Shipper, MD;  Location: [facility];  Service:  Endoscopy;  Laterality: N/A;   ESOPHAGOGASTRODUODENOSCOPY (EGD) WITH PROPOFOL N/A 02/07/2020   Procedure: ESOPHAGOGASTRODUODENOSCOPY (EGD) WITH PROPOFOL;  Surgeon: Irene Shipper, MD;  Location: [facility];  Service: Endoscopy;  Laterality: N/A;   FOREIGN BODY REMOVAL  10/11/2019   Procedure: FOREIGN BODY REMOVAL;  Surgeon: Gatha Mayer, MD;  Location: [facility];  Service: Endoscopy;;   SAVORY DILATION N/A 11/15/2019   Procedure: Azzie Almas DILATION;  Surgeon: Irene Shipper, MD;  Location: [facility];  Service: Endoscopy;  Laterality: N/A;   SAVORY DILATION N/A 12/27/2019   Procedure: SAVORY DILATION;  Surgeon: Irene Shipper, MD;  Location: [facility];  Service: Endoscopy;  Laterality: N/A;  Needs Fluroscopy   SAVORY DILATION N/A 02/07/2020   Procedure: SAVORY DILATION;  Surgeon: Irene Shipper, MD;  Location: [facility];  Service: Endoscopy;  Laterality: N/A;  Need Fluro    Family History  Problem Relation Age of Onset   Dementia Mother    Heart disease Mother 23       sudden cardiac---?? heart block   Heart disease Father        CABG x2, apparently 29rd open heart surgery.    Heart attack Father    Stroke Father        39 massive stroke led to passing.   Stroke Sister        age 10. healthy prior to this- [place]   Hypertension Sister    Atrial fibrillation Sister    Colon cancer Neg Hx    Stomach cancer Neg  Hx    Rectal cancer Neg Hx    Pancreatic cancer Neg Hx    Esophageal cancer Neg Hx     Medications- reviewed and updated Current Outpatient Medications  Medication Sig Dispense Refill   Calcium Carb-Cholecalciferol (CALCIUM 600+D3 PO) Take 1 tablet by mouth daily.     Cyanocobalamin (VITAMIN B-12 PO) Place 1 mL under the tongue daily.      omeprazole (PRILOSEC) 40 MG capsule Take 1 capsule (40 mg total) by mouth daily. 90 capsule 3   triamcinolone cream (KENALOG) 0.1 % Apply 1 application topically 2 (two) times daily. For 7-10 days maximum 80 g 0   No current  facility-administered medications for this visit.    Allergies-reviewed and updated Allergies  Allergen Reactions   Adhesive [Tape] Swelling   E-Mycin [Erythromycin Base] Swelling   Silicone Rash    Patient states that she has some silicone glasses that Breaks her face out. No other glasses does that to her.     Social History   Social History Narrative   Married for 26 years. No kids. 4 cats (lost one 06/2014, another in 2016). 1 cat in chemo in 2017 for 3 years      Retired finally December 2020.    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       Socializes way too little reportedly   Loves to exercise, enjoys the [place] ([place] [employer] [place] boating, has place in [place]. Thinking about retirement by age 83.   Eats healthy-kale, spinach beans   Objective  Objective:  BP 130/73 Comment: patient did home monitoring after visit- most recent home reading listed  Pulse 78   Temp 98.3 F (36.8 C) (Temporal)   Ht '5\' 2"'$  (1.575 m)   Wt 109 lb 6.4 oz (49.6 kg)   SpO2 100%   BMI 20.01 kg/m  Gen: NAD, resting comfortably HEENT: Mucous membranes are moist. Oropharynx normal Neck: no thyromegaly CV: RRR no murmurs rubs or gallops Lungs: CTAB no crackles, wheeze, rhonchi Abdomen: soft/nontender/nondistended/normal bowel sounds. No rebound or guarding.  Ext: no edema Skin: warm, dry Neuro: grossly normal, moves all extremities, PERRLA Breasts: normal appearance, no masses or tenderness    Assessment and Plan   65 y.o. female presenting for annual physical.  Health Maintenance counseling: 1. Anticipatory guidance: Patient counseled regarding regular dental exams -q6 months, eye exams - every 2 years,  avoiding smoking and second hand smoke , limiting alcohol to 1 beverage per day- up to 3 glasses of wine per day-she will work towards 1 a day to  protect liver health .   2. Risk factor reduction:  Advised patient of need for regular exercise and diet rich and fruits and vegetables to reduce risk of heart attack and stroke. Exercise- golfing most days- averaging 3 -7 miles on top of that- avg typically around 5. Diet-reasonably healthy diet.  Wt Readings from Last 3 Encounters:  08/20/21 109 lb 6.4 oz (49.6 kg)  07/12/21 107 lb (48.5 kg)  05/18/21 108 lb 12.8 oz (49.4 kg)  3. Immunizations/screenings/ancillary studies DISCUSSED:  -COVID-19 vaccination #2 has been completed-plans to wait on new variant specific vaccination -Prevnar 20 vaccination #1- today   -Flu vaccination (last one 10/03/2020)- today   Immunization History  Administered Date(s) Administered   Fluad Quad(high Dose 65+) 08/20/2021   Influenza Split 10/21/2011,  10/26/2012   Influenza,inj,Quad PF,6+ Mos 09/29/2013, 09/06/2014, 10/24/2015, 09/24/2016, 10/07/2017, 10/01/2018, 09/07/2019, 10/03/2020   Moderna Sars-Covid-2 Vaccination 03/08/2020, 04/05/2020   PNEUMOCOCCAL CONJUGATE-20 08/20/2021   Tdap 04/20/2012   Zoster Recombinat (Shingrix) 12/17/2018, 03/02/2019   4. Cervical cancer screening- normal Pap smear January 2019 with HPV negative-no further testing was required as will be over 65 with next needed  5. Breast cancer screening-  breast exam normal and mammogram 06/18/21 6. Colon cancer screening -  May 2012 with 10 year repeat planned- scheduled for september 7. Skin cancer screening- dermatology yearly with Dr.Gould. advised regular sunscreen use. Denies worrisome, changing, or new skin lesions.  8. Birth control/STD check- postmenopausal/monogamous-declined as a result 9. Osteoporosis screening at 3- Improved worst T score-2.4 in March 2020-technically had osteoporosis based on prior one-time elevation but had worked on weightbearing exercise, calcium,diet and vitamin D. Repeat again March 2022 or later- already scheduled for ocotber  -Never smoker  Status  of chronic or acute concerns   #Social update-father passed in sept 2021- still working through estate- still stressful.   sister after massive stroke at [facility]- now at [facility] - recovered well physically but speech still doing well   #Rash S:irritation. Where bra strap/sports bra came across her chest. Improved without sports bra -recommended to try OTC hydrocortisone cream when irritated-if worsen within week or so-could have sent in stronger triamcinolone cream. A/P:stable recently though intermittent issues- continue to monitor  # GERD with history of esophageal stricture requiring multiple dilations #b12 deficiency S:Medication: Omeprazole 40 mg daily-not ideal for osteoporosis but needed for history of stricture B12 levels related to PPI use: Have been normal-he takes B12 1 mL every other day- doing half ml daily.  A/P: GERD stable- check in on b12 with labs- continue current meds for on    #hyperlipidemia S: Medication: no medication  -Unable to calculate ASCVD risk with HDL over 100 Lab Results  Component Value Date   CHOL 290 (H) 08/20/2021   HDL 103.70 08/20/2021   LDLCALC 174 (H) 08/20/2021   LDLDIRECT 140.1 04/13/2012   TRIG 62.0 08/20/2021   CHOLHDL 3 08/20/2021  A/P: hopefully stable or improved- update lipid panel today  #Thyroid disease-history of Graves' disease treated with PTU in the past.  No current prescription.  Updated TSH with labs- was normal (08/14/2020) -check in on this today    #Insomnia-sparing amitriptyline for stress/anxiety/sleep in the past. Had some on hand but not have to use- has not used and now off med list. Better after retirement   #hypertension-previously listed as whitecoat hypertension but home numbers later elevated S: medications: none Home readings #s: off random home checks 11 readings since 05/19/21 avg 142.5455 78.45455   BP Readings from Last 3 Encounters:  08/20/21 130/73  07/12/21 (!) 144/80   05/18/21 (!) 160/90  A/P: blood pressure elevated today and high at home- technically this is a new diagnosis of hypertension but before we start meds shed prefer to monitor more regularly over next 2 weeks then we can reevaluate and determine if we want to start possible amlodipine- she does not tolerate pills though so we discussed enalapril  due to esophageal issues - discussed cough concern and risk of angioedema  #Elevated CBGs in the past-A1c not elevated in 2020  Lab Results  Component Value Date   HGBA1C 5.8 08/20/2021   HGBA1C 5.5 12/09/2019   Recommended follow up: follow up to be determined by home blood pressure readings- goal <135/85  Future Appointments  Date Time Provider [place]  09/14/2021  3:30 PM Irene Shipper, MD [facility]  09/21/2021 11:00 AM [facility] HEALTH COACH [facility]  10/03/2021  9:00 AM [facility] DX DEXA 1 [facility] GI-BREAST [facility]  08/22/2022  9:20 AM Yong Channel, Brayton Mars, MD [facility]   Lab/Order associations: fasting   ICD-10-CM   1. Preventative health care  Z00.00     2. B12 deficiency  E53.8 Vitamin B12    3. Thyroid disease  E07.9 TSH    4. Hyperlipidemia, unspecified hyperlipidemia type  E78.5 CBC with Differential/Platelet    Comprehensive metabolic panel    Lipid panel    5. Essential hypertension  I10     6. Gastroesophageal reflux disease with esophagitis, unspecified whether hemorrhage  K21.00     7. Hyperglycemia  R73.9 Hemoglobin A1c    8. Immunization due  Z23 Pneumococcal conjugate vaccine 20-valent (Prevnar 20)    9. Need for immunization against influenza  Z23 Flu Vaccine QUAD High Dose(Fluad)     No orders of the defined types were placed in this encounter.  I,Jada Bradford,acting as a scribe for Garret Reddish, MD.,have documented all relevant documentation on the behalf of Garret Reddish, MD,as directed by  Garret Reddish, MD while in the presence of Garret Reddish, MD.   I, Garret Reddish, MD, have reviewed  all documentation for this visit. The documentation on 09/13/21 for the exam, diagnosis, procedures, and orders are all accurate and complete.   Return precautions advised.  Garret Reddish, MD

## 2021-08-20 ENCOUNTER — Encounter: Payer: Self-pay | Admitting: Family Medicine

## 2021-08-20 ENCOUNTER — Other Ambulatory Visit: Payer: Self-pay

## 2021-08-20 ENCOUNTER — Ambulatory Visit (INDEPENDENT_AMBULATORY_CARE_PROVIDER_SITE_OTHER): Payer: Medicare Other | Admitting: Family Medicine

## 2021-08-20 VITALS — BP 130/73 | HR 78 | Temp 98.3°F | Ht 62.0 in | Wt 109.4 lb

## 2021-08-20 DIAGNOSIS — R739 Hyperglycemia, unspecified: Secondary | ICD-10-CM | POA: Diagnosis not present

## 2021-08-20 DIAGNOSIS — Z0000 Encounter for general adult medical examination without abnormal findings: Secondary | ICD-10-CM

## 2021-08-20 DIAGNOSIS — Z23 Encounter for immunization: Secondary | ICD-10-CM

## 2021-08-20 DIAGNOSIS — E079 Disorder of thyroid, unspecified: Secondary | ICD-10-CM | POA: Diagnosis not present

## 2021-08-20 DIAGNOSIS — E785 Hyperlipidemia, unspecified: Secondary | ICD-10-CM

## 2021-08-20 DIAGNOSIS — E538 Deficiency of other specified B group vitamins: Secondary | ICD-10-CM | POA: Diagnosis not present

## 2021-08-20 DIAGNOSIS — I10 Essential (primary) hypertension: Secondary | ICD-10-CM

## 2021-08-20 DIAGNOSIS — K2100 Gastro-esophageal reflux disease with esophagitis, without bleeding: Secondary | ICD-10-CM

## 2021-08-20 LAB — COMPREHENSIVE METABOLIC PANEL
ALT: 17 U/L (ref 0–35)
AST: 29 U/L (ref 0–37)
Albumin: 4.5 g/dL (ref 3.5–5.2)
Alkaline Phosphatase: 68 U/L (ref 39–117)
BUN: 19 mg/dL (ref 6–23)
CO2: 28 mEq/L (ref 19–32)
Calcium: 9.5 mg/dL (ref 8.4–10.5)
Chloride: 100 mEq/L (ref 96–112)
Creatinine, Ser: 0.79 mg/dL (ref 0.40–1.20)
GFR: 78.59 mL/min (ref >60.00)
Glucose, Bld: 91 mg/dL (ref 70–99)
Potassium: 4.2 mEq/L (ref 3.5–5.1)
Sodium: 139 mEq/L (ref 135–145)
Total Bilirubin: 0.5 mg/dL (ref 0.2–1.2)
Total Protein: 7.8 g/dL (ref 6.0–8.3)

## 2021-08-20 LAB — LIPID PANEL
Cholesterol: 290 mg/dL — ABNORMAL HIGH (ref 0–200)
HDL: 103.7 mg/dL (ref >39.00)
LDL Cholesterol: 174 mg/dL — ABNORMAL HIGH (ref 0–99)
NonHDL: 186.04
Total CHOL/HDL Ratio: 3
Triglycerides: 62 mg/dL (ref 0.0–149.0)
VLDL: 12.4 mg/dL (ref 0.0–40.0)

## 2021-08-20 LAB — CBC WITH DIFFERENTIAL/PLATELET
Basophils Absolute: 0.1 10*3/uL (ref 0.0–0.1)
Basophils Relative: 2.2 % (ref 0.0–3.0)
Eosinophils Absolute: 0.1 10*3/uL (ref 0.0–0.7)
Eosinophils Relative: 1.8 % (ref 0.0–5.0)
HCT: 39.7 % (ref 36.0–46.0)
Hemoglobin: 13.1 g/dL (ref 12.0–15.0)
Lymphocytes Relative: 30.8 % (ref 12.0–46.0)
Lymphs Abs: 1.4 10*3/uL (ref 0.7–4.0)
MCHC: 33 g/dL (ref 30.0–36.0)
MCV: 100.1 fl — ABNORMAL HIGH (ref 78.0–100.0)
Monocytes Absolute: 0.7 10*3/uL (ref 0.1–1.0)
Monocytes Relative: 16.3 % — ABNORMAL HIGH (ref 3.0–12.0)
Neutro Abs: 2.2 10*3/uL (ref 1.4–7.7)
Neutrophils Relative %: 48.9 % (ref 43.0–77.0)
Platelets: 236 10*3/uL (ref 150.0–400.0)
RBC: 3.96 Mil/uL (ref 3.87–5.11)
RDW: 13.7 % (ref 11.5–15.5)
WBC: 4.5 10*3/uL (ref 4.0–10.5)

## 2021-08-20 LAB — TSH: TSH: 3.3 u[IU]/mL (ref 0.35–5.50)

## 2021-08-20 LAB — HEMOGLOBIN A1C: Hgb A1c MFr Bld: 5.8 % (ref 4.6–6.5)

## 2021-08-20 LAB — VITAMIN B12: Vitamin B-12: 1307 pg/mL — ABNORMAL HIGH (ref 211–911)

## 2021-08-20 NOTE — Addendum Note (Signed)
Addended by: Linton Ham on: 08/20/2021 03:40 PM   Modules accepted: Orders

## 2021-08-20 NOTE — Patient Instructions (Addendum)
Health Maintenance Due  Topic Date Due   COVID-19 Vaccine (3 - Booster for Moderna series)   -Please consider new Variant-Specific COVID-19 vaccination in the Fall.  09/05/2020   COLONOSCOPY (Pts 45-43yr Insurance coverage will need to be confirmed)  - Scheduled for 08/2021   05/02/2021   PNA vac Low Risk Adult (1 of 2 - PCV13)   -Prevnar 20 done today in office. Never done   INFLUENZA VACCINE   -Done today in office.  07/23/2021   Please stop by lab before you go If you have mychart- we will send your results within 3 business days of [place] them.  If you do not have mychart- we will call you about results within 5 business days of [place] them.  *please also note that you will see labs on mychart as soon as they post. I will later go in and write notes on them- will say "notes from Dr. HYong Channel  Check blood pressue daily for 2 weeks and send MyChart message with all your readings.  Recommended follow up: Return for follow up to be determined by blood pressure. Definitely 1 year cpe  Great job with playing tennis and maintaining healthy eating! Keep up the great work!

## 2021-09-07 ENCOUNTER — Ambulatory Visit: Payer: Medicare Other

## 2021-09-12 ENCOUNTER — Encounter: Payer: Self-pay | Admitting: Family Medicine

## 2021-09-14 ENCOUNTER — Encounter: Payer: Self-pay | Admitting: Internal Medicine

## 2021-09-14 ENCOUNTER — Ambulatory Visit (AMBULATORY_SURGERY_CENTER): Payer: Medicare Other | Admitting: Internal Medicine

## 2021-09-14 ENCOUNTER — Other Ambulatory Visit: Payer: Self-pay

## 2021-09-14 VITALS — BP 129/71 | HR 69 | Temp 98.3°F | Resp 18 | Ht 61.5 in | Wt 107.0 lb

## 2021-09-14 DIAGNOSIS — Z1211 Encounter for screening for malignant neoplasm of colon: Secondary | ICD-10-CM

## 2021-09-14 MED ORDER — SODIUM CHLORIDE 0.9 % IV SOLN: 500.0000 mL | Freq: Once | INTRAVENOUS | Status: DC

## 2021-09-14 NOTE — Op Note (Signed)
[place] Patient Name: Crystal [name] Procedure Date: 09/14/2021 11:59 AM MRN: [id number] Endoscopist: Docia Chuck. Henrene Pastor , MD Age: 65 Referring MD:  Date of Birth: September 18, 1956 Gender: Female Account #: [id number] Procedure:                Colonoscopy Indications:              Screening for colorectal malignant neoplasm.                            Negative index exam 2012 Medicines:                Monitored Anesthesia Care Procedure:                Pre-Anesthesia Assessment:                           - Prior to the procedure, a History and Physical                            was performed, and patient medications and                            allergies were reviewed. The patient's tolerance of                            previous anesthesia was also reviewed. The risks                            and benefits of the procedure and the sedation                            options and risks were discussed with the patient.                            All questions were answered, and informed consent                            was obtained. Prior Anticoagulants: The patient has                            taken no previous anticoagulant or antiplatelet                            agents. ASA Grade Assessment: II - A patient with                            mild systemic disease. After reviewing the risks                            and benefits, the patient was deemed in                            satisfactory condition to undergo the procedure.  After obtaining informed consent, the colonoscope                            was passed under direct vision. Throughout the                            procedure, the patient's blood pressure, pulse, and                            oxygen saturations were monitored continuously. The                            Olympus PCF-H190DL (#[id number]) Colonoscope was                            introduced through the anus and advanced to  the the                            cecum, identified by appendiceal orifice and                            ileocecal valve. The ileocecal valve, appendiceal                            orifice, and rectum were photographed. The quality                            of the bowel preparation was excellent. The                            colonoscopy was performed without difficulty. The                            patient tolerated the procedure well. The bowel                            preparation used was Plenvu via split dose                            instruction. Scope In: 12:07:35 PM Scope Out: 12:20:40 PM Scope Withdrawal Time: 0 hours 9 minutes 36 seconds  Total Procedure Duration: 0 hours 13 minutes 5 seconds  Findings:                 Multiple diverticula were found in the sigmoid                            colon.                           The exam was otherwise without abnormality on                            direct and retroflexion views. Complications:            No immediate complications. Estimated blood  loss:                            None. Estimated Blood Loss:     Estimated blood loss: none. Impression:               - Diverticulosis in the sigmoid colon.                           - The examination was otherwise normal on direct                            and retroflexion views.                           - No specimens collected. Recommendation:           - Repeat colonoscopy in 10 years for screening                            purposes.                           - Patient has a contact number available for                            emergencies. The signs and symptoms of potential                            delayed complications were discussed with the                            patient. Return to normal activities tomorrow.                            Written discharge instructions were provided to the                            patient.                           -  Resume previous diet.                           - Continue present medications. Docia Chuck. Henrene Pastor, MD 09/14/2021 12:29:40 PM This report has been signed electronically.

## 2021-09-14 NOTE — Patient Instructions (Signed)
Handout given for diverticulosis. You don't need another colonoscopy for 10 years! Resume previous diet and medications.   YOU HAD AN ENDOSCOPIC PROCEDURE TODAY AT [facility]:   Refer to the procedure report that was given to you for any specific questions about what was found during the examination.  If the procedure report does not answer your questions, please call your gastroenterologist to clarify.  If you requested that your care partner not be given the details of your procedure findings, then the procedure report has been included in a sealed envelope for you to review at your convenience later.  YOU SHOULD EXPECT: Some feelings of bloating in the abdomen. Passage of more gas than usual.  Walking can help get rid of the air that was put into your GI tract during the procedure and reduce the bloating. If you had a lower endoscopy (such as a colonoscopy or flexible sigmoidoscopy) you may notice spotting of blood in your stool or on the toilet paper. If you underwent a bowel prep for your procedure, you may not have a normal bowel movement for a few days.  Please Note:  You might notice some irritation and congestion in your nose or some drainage.  This is from the oxygen used during your procedure.  There is no need for concern and it should clear up in a day or so.  SYMPTOMS TO REPORT IMMEDIATELY:  Following lower endoscopy (colonoscopy or flexible sigmoidoscopy):  Excessive amounts of blood in the stool  Significant tenderness or worsening of abdominal pains  Swelling of the abdomen that is new, acute  Fever of 100F or higher  For urgent or emergent issues, a gastroenterologist can be reached at any hour by calling [phone]. Do not use MyChart messaging for urgent concerns.    DIET:  We do recommend a small meal at first, but then you may proceed to your regular diet.  Drink plenty of fluids but you should avoid alcoholic beverages for 24 hours.  ACTIVITY:   You should plan to take it easy for the rest of today and you should NOT DRIVE or use heavy machinery until tomorrow (because of the sedation medicines used during the test).    FOLLOW UP: Our staff will call the number listed on your records 48-72 hours following your procedure to check on you and address any questions or concerns that you may have regarding the information given to you following your procedure. If we do not reach you, we will leave a message.  We will attempt to reach you two times.  During this call, we will ask if you have developed any symptoms of COVID 19. If you develop any symptoms (ie: fever, flu-like symptoms, shortness of breath, cough etc.) before then, please call [phone].  If you test positive for Covid 19 in the 2 weeks post procedure, please call and report this information to [place].    If any biopsies were taken you will be contacted by phone or by letter within the next 1-3 weeks.  Please call us at [phone] if you have not heard about the biopsies in 3 weeks.    SIGNATURES/CONFIDENTIALITY: You and/or your care partner have signed paperwork which will be entered into your electronic medical record.  These signatures attest to the fact that that the information above on your After Visit Summary has been reviewed and is understood.  Full responsibility of the confidentiality of this discharge information lies with you and/or your care-partner.

## 2021-09-14 NOTE — Progress Notes (Signed)
PT taken to PACU. Monitors in place. VSS. Report given to RN. 

## 2021-09-14 NOTE — Progress Notes (Signed)
HISTORY OF PRESENT ILLNESS:  Crystal [name] is a 65 y.o. female who presents today for screening colonoscopy.  Previous colonoscopy for screening purposes May 2012 was normal.  She was seen earlier this year in the office regarding her esophageal disease.  No interval clinical changes.  Overall health remains excellent.  REVIEW OF SYSTEMS:  All non-GI ROS negative.  Past Medical History:  Diagnosis Date   Complication of anesthesia    kept a severe HA for 10 days after     Esophageal stenosis    Graves' disease 1985   treated with PTU   Hyperlipidemia    Insomnia    Osteoporosis    Wears glasses    White coat hypertension     Past Surgical History:  Procedure Laterality Date   BIOPSY  10/11/2019   Procedure: BIOPSY;  Surgeon: Gatha Mayer, MD;  Location: [facility];  Service: Endoscopy;;   COLONOSCOPY     ESOPHAGEAL DILATION  10/11/2019   Procedure: ESOPHAGEAL DILATION;  Surgeon: Gatha Mayer, MD;  Location: [facility];  Service: Endoscopy;;   ESOPHAGOGASTRODUODENOSCOPY N/A 10/11/2019   Procedure: ESOPHAGOGASTRODUODENOSCOPY (EGD);  Surgeon: Gatha Mayer, MD;  Location: [facility];  Service: Endoscopy;  Laterality: N/A;   ESOPHAGOGASTRODUODENOSCOPY (EGD) WITH PROPOFOL N/A 11/15/2019   Procedure: ESOPHAGOGASTRODUODENOSCOPY (EGD) WITH PROPOFOL With savary dilation, need fluroscopy;  Surgeon: Irene Shipper, MD;  Location: [facility];  Service: Endoscopy;  Laterality: N/A;   ESOPHAGOGASTRODUODENOSCOPY (EGD) WITH PROPOFOL N/A 12/27/2019   Procedure: ESOPHAGOGASTRODUODENOSCOPY (EGD) WITH PROPOFOL;  Surgeon: Irene Shipper, MD;  Location: [facility];  Service: Endoscopy;  Laterality: N/A;   ESOPHAGOGASTRODUODENOSCOPY (EGD) WITH PROPOFOL N/A 02/07/2020   Procedure: ESOPHAGOGASTRODUODENOSCOPY (EGD) WITH PROPOFOL;  Surgeon: Irene Shipper, MD;  Location: [facility];  Service: Endoscopy;  Laterality: N/A;   FOREIGN BODY REMOVAL  10/11/2019   Procedure: FOREIGN BODY  REMOVAL;  Surgeon: Gatha Mayer, MD;  Location: [facility];  Service: Endoscopy;;   SAVORY DILATION N/A 11/15/2019   Procedure: Azzie Almas DILATION;  Surgeon: Irene Shipper, MD;  Location: [facility];  Service: Endoscopy;  Laterality: N/A;   SAVORY DILATION N/A 12/27/2019   Procedure: SAVORY DILATION;  Surgeon: Irene Shipper, MD;  Location: [facility];  Service: Endoscopy;  Laterality: N/A;  Needs Fluroscopy   SAVORY DILATION N/A 02/07/2020   Procedure: SAVORY DILATION;  Surgeon: Irene Shipper, MD;  Location: [facility];  Service: Endoscopy;  Laterality: N/A;  Need Fluro    Social History Crystal [name]  reports that she has never smoked. She has never used smokeless tobacco. She reports current alcohol use of about 14.0 standard drinks per week. She reports that she does not use drugs.  family history includes Atrial fibrillation in her sister; Dementia in her mother; Heart attack in her father; Heart disease in her father; Heart disease (age of onset: 64) in her mother; Hypertension in her sister; Stroke in her father and sister.  Allergies  Allergen Reactions   Adhesive [Tape] Swelling   E-Mycin [Erythromycin Base] Swelling   Silicone Rash    Patient states that she has some silicone glasses that Breaks her face out. No other glasses does that to her.        PHYSICAL EXAMINATION:  Vital signs: BP 135/89   Pulse 88   Temp 98.3 F (36.8 C)   Ht 5' 1.5" (1.562 m)   Wt 107 lb (48.5 kg)   SpO2 99%   BMI 19.89 kg/m  General:  Well-developed, well-nourished, no acute distress HEENT: Sclerae are anicteric, conjunctiva pink. Oral mucosa intact Lungs: Clear Heart: Regular Abdomen: soft, nontender, nondistended, no obvious ascites, no peritoneal signs, normal bowel sounds. No organomegaly. Extremities: No edema Psychiatric: alert and oriented x3. Cooperative     ASSESSMENT:  1.  Average risk colon cancer screening.   PLAN:   1.  Screening colonoscopy

## 2021-09-18 ENCOUNTER — Telehealth: Payer: Self-pay

## 2021-09-18 NOTE — Telephone Encounter (Signed)
Second post procedure follow up call, no answer 

## 2021-09-18 NOTE — Telephone Encounter (Signed)
First post procedure follow up call, no answer 

## 2021-09-21 ENCOUNTER — Ambulatory Visit (INDEPENDENT_AMBULATORY_CARE_PROVIDER_SITE_OTHER): Payer: Medicare Other

## 2021-09-21 ENCOUNTER — Other Ambulatory Visit: Payer: Self-pay

## 2021-09-21 DIAGNOSIS — Z0000 Encounter for general adult medical examination without abnormal findings: Secondary | ICD-10-CM | POA: Diagnosis not present

## 2021-09-21 NOTE — Patient Instructions (Signed)
Crystal [name] , Thank you for taking time to come for your Medicare Wellness Visit. I appreciate your ongoing commitment to your health goals. Please review the following plan we discussed and let me know if I can assist you in the future.   Screening recommendations/referrals: Colonoscopy: Done 09/14/21 repeat every 10 years due 09/15/31 Mammogram: Done 06/18/21 repeat every year  Bone Density: Done 03/02/19 repeat every 2 years  Recommended yearly ophthalmology/optometry visit for glaucoma screening and checkup Recommended yearly dental visit for hygiene and checkup  Vaccinations: Influenza vaccine: Done 08/20/21 repeat every year  Pneumococcal vaccine: Completed  Tdap vaccine: Done 04/20/12 repeat every 10 years 04/20/22 Shingles vaccine: Completed 12/17/18 & 03/02/19  Covid-19:Completed 3/17 & 04/05/20   Advanced directives: Please bring a copy of your health care power of attorney and living will to the office at your convenience.  Conditions/risks identified: none at  this time  Next appointment: Follow up in one year for your annual wellness visit    Preventive Care 65 Years and Older, Female Preventive care refers to lifestyle choices and visits with your health care provider that can promote health and wellness. What does preventive care include? A yearly physical exam. This is also called an annual well check. Dental exams once or twice a year. Routine eye exams. Ask your health care provider how often you should have your eyes checked. Personal lifestyle choices, including: Daily care of your teeth and gums. Regular physical activity. Eating a healthy diet. Avoiding tobacco and drug use. Limiting alcohol use. Practicing safe sex. Taking low-dose aspirin every day. Taking vitamin and mineral supplements as recommended by your health care provider. What happens during an annual well check? The services and screenings done by your health care provider during your annual well check  will depend on your age, overall health, lifestyle risk factors, and family history of disease. Counseling  Your health care provider may ask you questions about your: Alcohol use. Tobacco use. Drug use. Emotional well-being. Home and relationship well-being. Sexual activity. Eating habits. History of falls. Memory and ability to understand (cognition). Work and work Statistician. Reproductive health. Screening  You may have the following tests or measurements: Height, weight, and BMI. Blood pressure. Lipid and cholesterol levels. These may be checked every 5 years, or more frequently if you are over 63 years old. Skin check. Lung cancer screening. You may have this screening every year starting at age 62 if you have a 30-pack-year history of smoking and currently smoke or have quit within the past 15 years. Fecal occult blood test (FOBT) of the stool. You may have this test every year starting at age 19. Flexible sigmoidoscopy or colonoscopy. You may have a sigmoidoscopy every 5 years or a colonoscopy every 10 years starting at age 39. Hepatitis C blood test. Hepatitis B blood test. Sexually transmitted disease (STD) testing. Diabetes screening. This is done by checking your blood sugar (glucose) after you have not eaten for a while (fasting). You may have this done every 1-3 years. Bone density scan. This is done to screen for osteoporosis. You may have this done starting at age 35. Mammogram. This may be done every 1-2 years. Talk to your health care provider about how often you should have regular mammograms. Talk with your health care provider about your test results, treatment options, and if necessary, the need for more tests. Vaccines  Your health care provider may recommend certain vaccines, such as: Influenza vaccine. This is recommended every year. Tetanus, diphtheria,  and acellular pertussis (Tdap, Td) vaccine. You may need a Td booster every 10 years. Zoster vaccine. You  may need this after age 64. Pneumococcal 13-valent conjugate (PCV13) vaccine. One dose is recommended after age 56. Pneumococcal polysaccharide (PPSV23) vaccine. One dose is recommended after age 9. Talk to your health care provider about which screenings and vaccines you need and how often you need them. This information is not intended to replace advice given to you by your health care provider. Make sure you discuss any questions you have with your health care provider. Document Released: 01/05/2016 Document Revised: 08/28/2016 Document Reviewed: 10/10/2015 Elsevier Interactive Patient Education  2017 [employer] Prevention in the Home Falls can cause injuries. They can happen to people of all ages. There are many things you can do to make your home safe and to help prevent falls. What can I do on the outside of my home? Regularly fix the edges of walkways and driveways and fix any cracks. Remove anything that might make you trip as you walk through a door, such as a raised step or threshold. Trim any bushes or trees on the path to your home. Use bright outdoor lighting. Clear any walking paths of anything that might make someone trip, such as rocks or tools. Regularly check to see if handrails are loose or broken. Make sure that both sides of any steps have handrails. Any raised decks and porches should have guardrails on the edges. Have any leaves, snow, or ice cleared regularly. Use sand or salt on walking paths during winter. Clean up any spills in your garage right away. This includes oil or grease spills. What can I do in the bathroom? Use night lights. Install grab bars by the toilet and in the tub and shower. Do not use towel bars as grab bars. Use non-skid mats or decals in the tub or shower. If you need to sit down in the shower, use a plastic, non-slip stool. Keep the floor dry. Clean up any water that spills on the floor as soon as it happens. Remove soap buildup  in the tub or shower regularly. Attach bath mats securely with double-sided non-slip rug tape. Do not have throw rugs and other things on the floor that can make you trip. What can I do in the bedroom? Use night lights. Make sure that you have a light by your bed that is easy to reach. Do not use any sheets or blankets that are too big for your bed. They should not hang down onto the floor. Have a firm chair that has side arms. You can use this for support while you get dressed. Do not have throw rugs and other things on the floor that can make you trip. What can I do in the kitchen? Clean up any spills right away. Avoid walking on wet floors. Keep items that you use a lot in easy-to-reach places. If you need to reach something above you, use a strong step stool that has a grab bar. Keep electrical cords out of the way. Do not use floor polish or wax that makes floors slippery. If you must use wax, use non-skid floor wax. Do not have throw rugs and other things on the floor that can make you trip. What can I do with my stairs? Do not leave any items on the stairs. Make sure that there are handrails on both sides of the stairs and use them. Fix handrails that are broken or loose. Make sure  that handrails are as long as the stairways. Check any carpeting to make sure that it is firmly attached to the stairs. Fix any carpet that is loose or worn. Avoid having throw rugs at the top or bottom of the stairs. If you do have throw rugs, attach them to the floor with carpet tape. Make sure that you have a light switch at the top of the stairs and the bottom of the stairs. If you do not have them, ask someone to add them for you. What else can I do to help prevent falls? Wear shoes that: Do not have high heels. Have rubber bottoms. Are comfortable and fit you well. Are closed at the toe. Do not wear sandals. If you use a stepladder: Make sure that it is fully opened. Do not climb a closed  stepladder. Make sure that both sides of the stepladder are locked into place. Ask someone to hold it for you, if possible. Clearly mark and make sure that you can see: Any grab bars or handrails. First and last steps. Where the edge of each step is. Use tools that help you move around (mobility aids) if they are needed. These include: Canes. Walkers. Scooters. Crutches. Turn on the lights when you go into a dark area. Replace any light bulbs as soon as they burn out. Set up your furniture so you have a clear path. Avoid moving your furniture around. If any of your floors are uneven, fix them. If there are any pets around you, be aware of where they are. Review your medicines with your doctor. Some medicines can make you feel dizzy. This can increase your chance of falling. Ask your doctor what other things that you can do to help prevent falls. This information is not intended to replace advice given to you by your health care provider. Make sure you discuss any questions you have with your health care provider. Document Released: 10/05/2009 Document Revised: 05/16/2016 Document Reviewed: 01/13/2015 Elsevier Interactive Patient Education  2017 [employer].

## 2021-09-21 NOTE — Progress Notes (Addendum)
Virtual Visit via Telephone Note  I connected with  Crystal [name] on 09/21/21 at 11:00 AM EDT by telephone and verified that I am speaking with the correct person using two identifiers.  Medicare Annual Wellness visit completed telephonically due to Covid-19 pandemic.   Persons participating in this call: This Health Coach and this patient.   Location: Patient: Home Provider: Office    I discussed the limitations, risks, security and privacy concerns of performing an evaluation and management service by telephone and the availability of in person appointments. The patient expressed understanding and agreed to proceed.  Unable to perform video visit due to video visit attempted and failed and/or patient does not have video capability.   Some vital signs may be absent or patient reported.   Crystal Brace, LPN   Subjective:   Crystal [name] is a 65 y.o. female who presents for an Initial Medicare Annual Wellness Visit.  Review of Systems     Cardiac Risk Factors include: advanced age (>85men, >75 women);hypertension;dyslipidemia     Objective:    There were no vitals filed for this visit. There is no height or weight on file to calculate BMI.  Advanced Directives 09/21/2021 02/07/2020 12/27/2019 11/15/2019 10/11/2019  Does Patient Have a Medical Advance Directive? Yes Yes No Yes Yes  Type of Paramedic of [place];Living will [place];Living will - [place];Living will [place];Living will  Copy of [place] in Chart? No - copy requested Yes - validated most recent copy scanned in chart (See row information) - No - copy requested -  Would patient like information on creating a medical advance directive? - - No - Patient declined - -    Current Medications (verified) Outpatient Encounter Medications as of 09/21/2021  Medication Sig   Calcium Carb-Cholecalciferol (CALCIUM  600+D3 PO) Take 1 tablet by mouth daily.   Cyanocobalamin (VITAMIN B-12 PO) Place 1 mL under the tongue daily.    omeprazole (PRILOSEC) 40 MG capsule Take 1 capsule (40 mg total) by mouth daily.   triamcinolone cream (KENALOG) 0.1 % Apply 1 application topically 2 (two) times daily. For 7-10 days maximum   No facility-administered encounter medications on file as of 09/21/2021.    Allergies (verified) Adhesive [tape], E-mycin [erythromycin base], and Silicone   History: Past Medical History:  Diagnosis Date   Complication of anesthesia    kept a severe HA for 10 days after     Esophageal stenosis    Graves' disease 1985   treated with PTU   Hyperlipidemia    Insomnia    Osteoporosis    Wears glasses    White coat hypertension    Past Surgical History:  Procedure Laterality Date   BIOPSY  10/11/2019   Procedure: BIOPSY;  Surgeon: Gatha Mayer, MD;  Location: [facility];  Service: Endoscopy;;   COLONOSCOPY     ESOPHAGEAL DILATION  10/11/2019   Procedure: ESOPHAGEAL DILATION;  Surgeon: Gatha Mayer, MD;  [facility];  Service: Endoscopy;;   ESOPHAGOGASTRODUODENOSCOPY N/A 10/11/2019   Procedure: ESOPHAGOGASTRODUODENOSCOPY (EGD);  Surgeon: Gatha Mayer, MD;  Location: [facility];  Service: Endoscopy;  Laterality: N/A;   ESOPHAGOGASTRODUODENOSCOPY (EGD) WITH PROPOFOL N/A 11/15/2019   Procedure: ESOPHAGOGASTRODUODENOSCOPY (EGD) WITH PROPOFOL With savary dilation, need fluroscopy;  Surgeon: Irene Shipper, MD;  [facility];  Service: Endoscopy;  Laterality: N/A;   ESOPHAGOGASTRODUODENOSCOPY (EGD) WITH PROPOFOL N/A 12/27/2019   Procedure:  ESOPHAGOGASTRODUODENOSCOPY (EGD) WITH PROPOFOL;  Surgeon: Irene Shipper, MD;  [facility];  Service: Endoscopy;  Laterality: N/A;   ESOPHAGOGASTRODUODENOSCOPY (EGD) WITH PROPOFOL N/A 02/07/2020   Procedure: ESOPHAGOGASTRODUODENOSCOPY (EGD) WITH PROPOFOL;  Surgeon: Irene Shipper, MD;  [facility];   Service: Endoscopy;  Laterality: N/A;   FOREIGN BODY REMOVAL  10/11/2019   Procedure: FOREIGN BODY REMOVAL;  Surgeon: Gatha Mayer, MD;  [facility];  Service: Endoscopy;;   SAVORY DILATION N/A 11/15/2019   Procedure: Azzie Almas DILATION;  Surgeon: Irene Shipper, MD;  [facility];  Service: Endoscopy;  Laterality: N/A;   SAVORY DILATION N/A 12/27/2019   Procedure: SAVORY DILATION;  Surgeon: Irene Shipper, MD;  [facility];  Service: Endoscopy;  Laterality: N/A;  Needs Fluroscopy   SAVORY DILATION N/A 02/07/2020   Procedure: SAVORY DILATION;  Surgeon: Irene Shipper, MD;  [facility];  Service: Endoscopy;  Laterality: N/A;  Need Fluro   Family History  Problem Relation Age of Onset   Dementia Mother    Heart disease Mother 54       sudden cardiac---?? heart block   Heart disease Father        CABG x2, apparently 18rd open heart surgery.    Heart attack Father    Stroke Father        35 massive stroke led to passing.   Stroke Sister        age 32. healthy prior to this- [place]   Hypertension Sister    Atrial fibrillation Sister    Colon cancer Neg Hx    Stomach cancer Neg Hx    Rectal cancer Neg Hx    Pancreatic cancer Neg Hx    Esophageal cancer Neg Hx    Social History   Socioeconomic History   Marital status: Married    Spouse name: Not on file   Number of children: Not on file   Years of education: Not on file   Highest education level: Not on file  Occupational History   Not on file  Tobacco Use   Smoking status: Never   Smokeless tobacco: Never  Vaping Use   Vaping Use: Never used  Substance and Sexual Activity   Alcohol use: Yes    Alcohol/week: 14.0 standard drinks    Types: 14 Glasses of wine per week   Drug use: No   Sexual activity: Not on file  Other Topics Concern   Not on file  Social History Narrative   Married for 26 years. No kids. 4 cats (lost one 06/2014, another in 2016). 1 cat in chemo in 2017 for 3  years      Retired finally December 2020.    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       Socializes way too little reportedly   Loves to exercise, [place] the [place] [employer] [place] boating, has place in [place]. Thinking about retirement by age 42.   Eats healthy-kale, spinach beans   Social Determinants of Health   Financial Resource Strain: Low Risk    Difficulty of Paying Living Expenses: Not hard at all  Food Insecurity: No Food Insecurity   Worried About Charity fundraiser in the Last Year: Never true   Ran Out of Food in the Last Year: Never true  Transportation  Needs: No Transportation Needs   Lack of Transportation (Medical): No   Lack of Transportation (Non-Medical): No  Physical Activity: Sufficiently Active   Days of Exercise per Week: 6 days   Minutes of Exercise per Session: 120 min  Stress: No Stress Concern Present   Feeling of Stress : Not at all  Social Connections: Moderately Integrated   Frequency of Communication with Friends and Family: More than three times a week   Frequency of Social Gatherings with Friends and Family: More than three times a week   Attends Religious Services: Never   Marine scientist or Organizations: Yes   Attends Music therapist: 1 to 4 times per year   Marital Status: Married    Tobacco Counseling Counseling given: Not Answered   Clinical Intake:  Pre-visit preparation completed: Yes  Pain : No/denies pain     BMI - recorded: 19.89 Nutritional Status: BMI of 19-24  Normal Nutritional Risks: None Diabetes: No  How often do you need to have someone help you when you read instructions, pamphlets, or other written materials from your doctor or pharmacy?: 1 - Never  Diabetic?No  Interpreter Needed?: No  Information entered by :: Charlott Rakes,  LPN   Activities of Daily Living In your present state of health, do you have any difficulty performing the following activities: 09/21/2021 05/18/2021  Hearing? N N  Vision? N N  Difficulty concentrating or making decisions? N N  Walking or climbing stairs? N N  Dressing or bathing? N N  Doing errands, shopping? N N  Preparing Food and eating ? N -  Using the Toilet? N -  In the past six months, have you accidently leaked urine? N -  Do you have problems with loss of bowel control? N -  Managing your Medications? N -  Managing your Finances? N -  Housekeeping or managing your Housekeeping? N -  Some recent data might be hidden    Patient Care Team: Marin Olp, MD as PCP - General (Family Medicine)  Indicate any recent Medical Services you may have received from other than [facility] providers in the past year (date may be approximate).     Assessment:   This is a routine wellness examination for [place].  Hearing/Vision screen Hearing Screening - Comments:: Pt denies any hearing issues  Vision Screening - Comments:: Pt follows up with Dr Prudencio Burly for bi annual eye exams   Dietary issues and exercise activities discussed: Current Exercise Habits: Home exercise routine, Type of exercise: walking;Other - see comments, Time (Minutes): > 60, Frequency (Times/Week): >7, Weekly Exercise (Minutes/Week): 0   Goals Addressed             This Visit's Progress    Patient Stated       None at this time        Depression Screen PHQ 2/9 Scores 09/21/2021 08/20/2021 05/18/2021 08/14/2020 08/09/2019 01/15/2018  PHQ - 2 Score 0 0 0 0 0 0  PHQ- 9 Score - - 0 0 - -    Fall Risk Fall Risk  09/21/2021 08/20/2021 05/18/2021 08/14/2020 01/15/2018  Falls in the past year? 1 1 1  0 No  Number falls in past yr: 1 0 0 0 -  Injury with Fall? 0 0 0 0 -  Risk for fall due to : Impaired vision No Fall Risks No Fall Risks - -  Follow up Falls prevention discussed Falls evaluation completed - - -     FALL  RISK PREVENTION PERTAINING TO THE HOME:  Any stairs in or around the home? Yes  If so, are there any without handrails? No  Home free of loose throw rugs in walkways, pet beds, electrical cords, etc? Yes  Adequate lighting in your home to reduce risk of falls? Yes   ASSISTIVE DEVICES UTILIZED TO PREVENT FALLS:  Life alert? No  Use of a cane, walker or w/c? No  Grab bars in the bathroom? No  Shower chair or bench in shower? No  Elevated toilet seat or a handicapped toilet? No   TIMED UP AND GO:  Was the test performed? No .   Cognitive Function:     6CIT Screen 09/21/2021  What Year? 0 points  What month? 0 points  What time? 0 points  Count back from 20 0 points  Months in reverse 0 points  Repeat phrase 0 points  Total Score 0    Immunizations Immunization History  Administered Date(s) Administered   Fluad Quad(high Dose 65+) 08/20/2021   Influenza Split 10/21/2011, 10/26/2012   Influenza,inj,Quad PF,6+ Mos 09/29/2013, 09/06/2014, 10/24/2015, 09/24/2016, 10/07/2017, 10/01/2018, 09/07/2019, 10/03/2020   Moderna Sars-Covid-2 Vaccination 03/08/2020, 04/05/2020   PNEUMOCOCCAL CONJUGATE-20 08/20/2021   Tdap 04/20/2012   Zoster Recombinat (Shingrix) 12/17/2018, 03/02/2019    TDAP status: Up to date  Flu Vaccine status: Up to date  Pneumococcal vaccine status: Up to date  Covid-19 vaccine status: Completed vaccines  Qualifies for Shingles Vaccine? Yes   Zostavax completed Yes   Shingrix Completed?: Yes  Screening Tests Health Maintenance  Topic Date Due   COVID-19 Vaccine (3 - Booster for Moderna series) 09/05/2020   PAP SMEAR-Modifier  01/15/2021   Hepatitis C Screening  12/28/2049 (Originally 05/22/1974)   TETANUS/TDAP  04/20/2022   MAMMOGRAM  06/19/2023   COLONOSCOPY (Pts 45-102yrs Insurance coverage will need to be confirmed)  09/15/2031   INFLUENZA VACCINE  Completed   DEXA SCAN  Completed   HIV Screening  Completed   Zoster Vaccines-  Shingrix  Completed   HPV VACCINES  Aged Out    Health Maintenance  Health Maintenance Due  Topic Date Due   COVID-19 Vaccine (3 - Booster for Moderna series) 09/05/2020   PAP SMEAR-Modifier  01/15/2021    Colorectal cancer screening: Type of screening: Colonoscopy. Completed 09/14/21. Repeat every 10 years  Mammogram status: Completed 06/18/21. Repeat every year  Bone Density status: Completed 03/02/19. Results reflect: Bone density results: OSTEOPENIA. Repeat every 2 years.  Additional Screening:  Hepatitis C Screening: does qualify;  Vision Screening: Recommended annual ophthalmology exams for early detection of glaucoma and other disorders of the eye. Is the patient up to date with their annual eye exam?  Yes Bi annually  Who is the provider or what is the name of the office in which the patient attends annual eye exams? Dr Prudencio Burly  If pt is not established with a provider, would they like to be referred to a provider to establish care? No .   Dental Screening: Recommended annual dental exams for proper oral hygiene  Community Resource Referral / Chronic Care Management: CRR required this visit?  No   CCM required this visit?  No      Plan:     I have personally reviewed and noted the following in the patient's chart:   Medical and social history Use of alcohol, tobacco or illicit drugs  Current medications and supplements including opioid prescriptions. Patient is not currently taking opioid prescriptions. Functional ability and status Nutritional status  Physical activity Advanced directives List of other physicians Hospitalizations, surgeries, and ER visits in previous 12 months Vitals Screenings to include cognitive, depression, and falls Referrals and appointments  In addition, I have reviewed and discussed with patient certain preventive protocols, quality metrics, and best practice recommendations. A written personalized care plan for preventive services as  well as general preventive health recommendations were provided to patient.     Crystal Brace, LPN   06/17/9484   Nurse Notes: None

## 2021-09-23 ENCOUNTER — Encounter: Payer: Self-pay | Admitting: Family Medicine

## 2021-09-27 ENCOUNTER — Encounter: Payer: Self-pay | Admitting: Family Medicine

## 2021-10-03 ENCOUNTER — Other Ambulatory Visit: Payer: Self-pay

## 2021-10-03 ENCOUNTER — Ambulatory Visit
Admission: RE | Admit: 2021-10-03 | Discharge: 2021-10-03 | Disposition: A | Discharge status: Home / Self Care | Payer: Medicare Other | Source: Ambulatory Visit | Attending: Family Medicine | Admitting: Family Medicine

## 2021-10-03 DIAGNOSIS — M8580 Other specified disorders of bone density and structure, unspecified site: Secondary | ICD-10-CM

## 2022-01-13 ENCOUNTER — Encounter: Payer: Self-pay | Admitting: Family Medicine

## 2022-01-14 MED ORDER — ACYCLOVIR 5 % EX CREA: TOPICAL_CREAM | CUTANEOUS | 0 refills | Status: DC

## 2022-05-28 ENCOUNTER — Other Ambulatory Visit: Payer: Self-pay | Admitting: Family Medicine

## 2022-05-28 DIAGNOSIS — Z1231 Encounter for screening mammogram for malignant neoplasm of breast: Secondary | ICD-10-CM

## 2022-08-21 ENCOUNTER — Other Ambulatory Visit: Payer: Self-pay | Admitting: Internal Medicine

## 2022-08-21 NOTE — Telephone Encounter (Signed)
Patient will need a yearly follow up

## 2022-08-22 ENCOUNTER — Encounter: Payer: Self-pay | Admitting: Family Medicine

## 2022-08-22 ENCOUNTER — Ambulatory Visit (INDEPENDENT_AMBULATORY_CARE_PROVIDER_SITE_OTHER): Payer: Medicare Other | Admitting: Family Medicine

## 2022-08-22 VITALS — BP 136/80 | HR 73 | Temp 98.0°F | Ht 61.5 in | Wt 110.0 lb

## 2022-08-22 DIAGNOSIS — Z1231 Encounter for screening mammogram for malignant neoplasm of breast: Secondary | ICD-10-CM

## 2022-08-22 DIAGNOSIS — E785 Hyperlipidemia, unspecified: Secondary | ICD-10-CM

## 2022-08-22 DIAGNOSIS — Z0000 Encounter for general adult medical examination without abnormal findings: Secondary | ICD-10-CM

## 2022-08-22 DIAGNOSIS — E079 Disorder of thyroid, unspecified: Secondary | ICD-10-CM | POA: Diagnosis not present

## 2022-08-22 DIAGNOSIS — R739 Hyperglycemia, unspecified: Secondary | ICD-10-CM | POA: Diagnosis not present

## 2022-08-22 DIAGNOSIS — E538 Deficiency of other specified B group vitamins: Secondary | ICD-10-CM | POA: Insufficient documentation

## 2022-08-22 LAB — COMPREHENSIVE METABOLIC PANEL
ALT: 15 U/L (ref 0–35)
AST: 28 U/L (ref 0–37)
Albumin: 4.7 g/dL (ref 3.5–5.2)
Alkaline Phosphatase: 59 U/L (ref 39–117)
BUN: 16 mg/dL (ref 6–23)
CO2: 27 mEq/L (ref 19–32)
Calcium: 9.5 mg/dL (ref 8.4–10.5)
Chloride: 100 mEq/L (ref 96–112)
Creatinine, Ser: 0.74 mg/dL (ref 0.40–1.20)
GFR: 84.41 mL/min (ref >60.00)
Glucose, Bld: 105 mg/dL — ABNORMAL HIGH (ref 70–99)
Potassium: 4.6 mEq/L (ref 3.5–5.1)
Sodium: 138 mEq/L (ref 135–145)
Total Bilirubin: 0.5 mg/dL (ref 0.2–1.2)
Total Protein: 8 g/dL (ref 6.0–8.3)

## 2022-08-22 LAB — LIPID PANEL
Cholesterol: 312 mg/dL — ABNORMAL HIGH (ref 0–200)
HDL: 108.6 mg/dL (ref >39.00)
LDL Cholesterol: 190 mg/dL — ABNORMAL HIGH (ref 0–99)
NonHDL: 203.14
Total CHOL/HDL Ratio: 3
Triglycerides: 67 mg/dL (ref 0.0–149.0)
VLDL: 13.4 mg/dL (ref 0.0–40.0)

## 2022-08-22 LAB — TSH: TSH: 3.34 u[IU]/mL (ref 0.35–5.50)

## 2022-08-22 LAB — CBC WITH DIFFERENTIAL/PLATELET
Basophils Absolute: 0.1 10*3/uL (ref 0.0–0.1)
Basophils Relative: 0.9 % (ref 0.0–3.0)
Eosinophils Absolute: 0.1 10*3/uL (ref 0.0–0.7)
Eosinophils Relative: 1.6 % (ref 0.0–5.0)
HCT: 40.8 % (ref 36.0–46.0)
Hemoglobin: 13.5 g/dL (ref 12.0–15.0)
Lymphocytes Relative: 22.3 % (ref 12.0–46.0)
Lymphs Abs: 1.6 10*3/uL (ref 0.7–4.0)
MCHC: 33 g/dL (ref 30.0–36.0)
MCV: 101.7 fl — ABNORMAL HIGH (ref 78.0–100.0)
Monocytes Absolute: 0.7 10*3/uL (ref 0.1–1.0)
Monocytes Relative: 10.3 % (ref 3.0–12.0)
Neutro Abs: 4.6 10*3/uL (ref 1.4–7.7)
Neutrophils Relative %: 64.9 % (ref 43.0–77.0)
Platelets: 237 10*3/uL (ref 150.0–400.0)
RBC: 4.02 Mil/uL (ref 3.87–5.11)
RDW: 13.8 % (ref 11.5–15.5)
WBC: 7 10*3/uL (ref 4.0–10.5)

## 2022-08-22 LAB — HEMOGLOBIN A1C: Hgb A1c MFr Bld: 5.7 % (ref 4.6–6.5)

## 2022-08-22 LAB — VITAMIN B12: Vitamin B-12: 591 pg/mL (ref 211–911)

## 2022-08-22 NOTE — Progress Notes (Signed)
Phone [phone]   Subjective:  Patient presents today for their annual physical. Chief complaint-noted.   See problem oriented charting- ROS- full  review of systems was completed and negative Per full ROS sheet completed by patient  The following were reviewed and entered/updated in epic: Past Medical History:  Diagnosis Date   Complication of anesthesia    kept a severe HA for 10 days after     Esophageal stenosis    Graves' disease 1985   treated with PTU   Hyperlipidemia    Insomnia    Osteoporosis    Wears glasses    White coat hypertension    Patient Active Problem List   Diagnosis Date Noted   Osteoporosis 07/26/2014    Priority: High   Hyperglycemia 08/22/2022    Priority: Medium    B12 deficiency 08/22/2022    Priority: Medium    Esophageal stricture     Priority: Medium    Hyperlipidemia 01/15/2018    Priority: Medium    Elevated blood pressure reading in office with white coat syndrome, without diagnosis of hypertension 07/26/2014    Priority: Medium    Thyroid disease     Priority: Medium    Past Surgical History:  Procedure Laterality Date   BIOPSY  10/11/2019   Procedure: BIOPSY;  Surgeon: Gatha Mayer, MD;  [facility];  Service: Endoscopy;;   COLONOSCOPY     ESOPHAGEAL DILATION  10/11/2019   Procedure: ESOPHAGEAL DILATION;  Surgeon: Gatha Mayer, MD;  [facility];  Service: Endoscopy;;   ESOPHAGOGASTRODUODENOSCOPY N/A 10/11/2019   Procedure: ESOPHAGOGASTRODUODENOSCOPY (EGD);  Surgeon: Gatha Mayer, MD;  Location: [facility];  Service: Endoscopy;  Laterality: N/A;   ESOPHAGOGASTRODUODENOSCOPY (EGD) WITH PROPOFOL N/A 11/15/2019   Procedure: ESOPHAGOGASTRODUODENOSCOPY (EGD) WITH PROPOFOL With savary dilation, need fluroscopy;  Surgeon: Irene Shipper, MD;  [facility];  Service: Endoscopy;  Laterality: N/A;   ESOPHAGOGASTRODUODENOSCOPY (EGD) WITH PROPOFOL N/A 12/27/2019   Procedure:  ESOPHAGOGASTRODUODENOSCOPY (EGD) WITH PROPOFOL;  Surgeon: Irene Shipper, MD;  [facility];  Service: Endoscopy;  Laterality: N/A;   ESOPHAGOGASTRODUODENOSCOPY (EGD) WITH PROPOFOL N/A 02/07/2020   Procedure: ESOPHAGOGASTRODUODENOSCOPY (EGD) WITH PROPOFOL;  Surgeon: Irene Shipper, MD;  [facility];  Service: Endoscopy;  Laterality: N/A;   FOREIGN BODY REMOVAL  10/11/2019   Procedure: FOREIGN BODY REMOVAL;  Surgeon: Gatha Mayer, MD;  [facility];  Service: Endoscopy;;   SAVORY DILATION N/A 11/15/2019   Procedure: Azzie Almas DILATION;  Surgeon: Irene Shipper, MD;  [facility];  Service: Endoscopy;  Laterality: N/A;   SAVORY DILATION N/A 12/27/2019   Procedure: SAVORY DILATION;  Surgeon: Irene Shipper, MD;  [facility];  Service: Endoscopy;  Laterality: N/A;  Needs Fluroscopy   SAVORY DILATION N/A 02/07/2020   Procedure: SAVORY DILATION;  Surgeon: Irene Shipper, MD;  [facility];  Service: Endoscopy;  Laterality: N/A;  Need Fluro    Family History  Problem Relation Age of Onset   Dementia Mother    Heart disease Mother 47       sudden cardiac---?? heart block   Heart disease Father        CABG x2, apparently 57rd open heart surgery.    Heart attack Father    Stroke Father        79 massive stroke led to passing.   Stroke Sister        age 87. healthy prior to this- [address] [place]  Hypertension Sister    Atrial fibrillation Sister    Colon cancer Neg Hx    Stomach cancer Neg Hx    Rectal cancer Neg Hx    Pancreatic cancer Neg Hx    Esophageal cancer Neg Hx     Medications- reviewed and updated Current Outpatient Medications  Medication Sig Dispense Refill   acyclovir cream (ZOVIRAX) 5 % Apply to affected area 5x daily for 4 days. 15 g 0   Calcium Carb-Cholecalciferol (CALCIUM 600+D3 PO) Take 1 tablet by mouth daily.     Cyanocobalamin (VITAMIN B-12 PO) Place 1 mL under the tongue daily.      omeprazole (PRILOSEC) 40 MG  capsule TAKE 1 CAPSULE (40 MG TOTAL) BY MOUTH DAILY. 90 capsule 0   triamcinolone cream (KENALOG) 0.1 % Apply 1 application topically 2 (two) times daily. For 7-10 days maximum 80 g 0   No current facility-administered medications for this visit.    Allergies-reviewed and updated Allergies  Allergen Reactions   Adhesive [Tape] Swelling   E-Mycin [Erythromycin Base] Swelling   Silicone Rash    Patient states that she has some silicone glasses that Breaks her face out. No other glasses does that to her.     Social History   Social History Narrative   Married for 26 years. No kids. 4 cats (lost one 06/2014, another in 2016). 1 cat in chemo in 2017 for 3 years      Retired finally December 2020.    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       Socializes way too little reportedly   Loves to exercise, [place] [employer] [place] boating, has place in [place]. Thinking about retirement by age 5.   Eats healthy-kale, spinach beans   Objective  Objective:  BP 136/80   Pulse 73   Temp 98 F (36.7 C)   Ht 5' 1.5" (1.562 m)   Wt 110 lb (49.9 kg)   SpO2 99%   BMI 20.45 kg/m  Gen: NAD, resting comfortably HEENT: Mucous membranes are moist. Oropharynx normal Neck: no thyromegaly CV: RRR no murmurs rubs or gallops Lungs: CTAB no crackles, wheeze, rhonchi Abdomen: soft/nontender/nondistended/normal bowel sounds. No rebound or guarding.  Ext: no edema Skin: warm, dry Neuro: grossly normal, moves all extremities, PERRLA   Assessment and Plan   66 y.o. female presenting for annual physical.  Health Maintenance counseling: 1. Anticipatory guidance: Patient counseled regarding regular dental exams -q6 months, eye exams - every 2 years- scheduled sept 11,  avoiding smoking and second hand smoke , limiting alcohol to 1 beverage per day-  down from 3 to 2 glasses of wine a night- congratulated on progress- continue to work towards 1 a day for liver health , no illicit drugs.   2. Risk factor reduction:  Advised patient of need for regular exercise and diet rich and fruits and vegetables to reduce risk of heart attack and stroke.  Exercise- golfing daily and still walking 5-7 miles a day. May add pickleball Diet/weight management-reasonably health diet- slightly weight gain.  Wt Readings from Last 3 Encounters:  08/22/22 110 lb (49.9 kg)  09/14/21 107 lb (48.5 kg)  08/20/21 109 lb 6.4 oz (49.6 kg)  3. Immunizations/screenings/ancillary studies- high dose flu shot later in season, consider RSV at pharmacy- wants to hold off, consider covid shot- wants  to hold off. Tdap recommended at pharmacy Immunization History  Administered Date(s) Administered   Fluad Quad(high Dose 65+) 08/20/2021   Influenza Split 10/21/2011, 10/26/2012   Influenza,inj,Quad PF,6+ Mos 09/29/2013, 09/06/2014, 10/24/2015, 09/24/2016, 10/07/2017, 10/01/2018, 09/07/2019, 10/03/2020   Moderna Sars-Covid-2 Vaccination 03/08/2020, 04/05/2020, 11/28/2020   PNEUMOCOCCAL CONJUGATE-20 08/20/2021   Tdap 04/20/2012   Zoster Recombinat (Shingrix) 12/17/2018, 03/02/2019  4. Cervical cancer screening- normal pap January 2019 with HPV negative- good for 5 years and now technically passed age based screening recommendations. We will consider pap and pelvic next year 5. Breast cancer screening-  breast exam - slef exams- sometimes wants in office but getting mammogram next week and wants to hold off and mammogram - scheduled next week and on file from June 2022 as well 6. Colon cancer screening - September 14 2021 with 10 year repeat 7. Skin cancer screening- dermatology q2 yearly with Dr. Delman Cheadle. advised regular sunscreen use. Denies worrisome, changing, or new skin lesions.  8. Birth control/STD check- postmenopause and monogamous  9. Osteoporosis screening at [facility] t  score 02/24 February 2019- technically had osteoprososis based on prior one time evaluation but worked on weight bearing,calcium, diet, vitamin D- stable October 2022- we can repeat in 2-3 years 53. Smoking associated screening - never smoker  Status of chronic or acute concerns   #Rash- prior around brastrap has resolved  # GERD with history of esophageal stricture requiring multiple dilations S:Medication: Omeprazole 40 mg daily-not ideal for osteoporosis/osteopenia but needed for history of stricture A/P: Controlled. Continue current medications. Has had follow up with Dr. Gwenlyn Found    #hyperlipidemia S: Medication: no medication  -Unable to calculate ASCVD risk with HDL over 100 Lab Results  Component Value Date   CHOL 290 (H) 08/20/2021   HDL 103.70 08/20/2021   LDLCALC 174 (H) 08/20/2021   LDLDIRECT 140.1 04/13/2012   TRIG 62.0 08/20/2021   CHOLHDL 3 08/20/2021  A/P: update lipids- we did briefly discuss possible changes in HDL guidance down the line- but no clear changes at present so unlikely to start statin  #Thyroid disease-history of Graves' disease treated with PTU in the past.  No current prescription.  update tsh with labs   #hypertension-previously listed as whitecoat hypertension but home numbers later elevated S: Home readings #s: 110s to 130s over 60s to 70s (occasional 80)- stress dropped from last year (new diagnosis last year with home readings 742 systolic average) A/P: controlled without meds- I was honest with her this really could be more whitecoat elevated blood pressure/stress at home combination previously- stress is better this year and blood pressure better   # Hyperglycemia/insulin resistance/prediabetes-peak A1c 5.8 2022 S:  Medication: none Exercise and diet- doing well and stress better Lab Results  Component Value Date   HGBA1C 5.8 08/20/2021   A/P: hopefully improve-d update a1c today- may be better with improved stress   # B12 deficiency S: Current  treatment/medication (oral vs. IM): Prefers oral treatment- doing well with once a week A/P: hopefully stable- update b12 today. Continue current meds for now   Recommended follow up: Return in about 1 year (around 08/23/2023) for physical or sooner if needed.Schedule b4 you leave. Future Appointments  Date Time Provider [place]  08/30/2022  2:40 PM [facility] 3 [facility]  10/07/2022 11:00 AM [facility]   Lab/Order associations: fasting   ICD-10-CM   1. Preventative health care  Z00.00     2. Hyperglycemia  R73.9  3. Thyroid disease  E07.9     4. Hyperlipidemia, unspecified hyperlipidemia type  E78.5     5. B12 deficiency  E53.8       No orders of the defined types were placed in this encounter.   Return precautions advised.  Garret Reddish, MD

## 2022-08-22 NOTE — Patient Instructions (Addendum)
Health Maintenance Due  Topic Date Due   TETANUS/TDAP -let us know when you get this at the pharmacy 04/20/2022   INFLUENZA VACCINE -let us know when you get this at the pharmacy 07/23/2022   Please stop by lab before you go If you have mychart- we will send your results within 3 business days of [place] receiving them.  If you do not have mychart- we will call you about results within 5 business days of [place] receiving them.  *please also note that you will see labs on mychart as soon as they post. I will later go in and write notes on them- will say "notes from Dr. Yong Channel"   Recommended follow up: Return in about 1 year (around 08/23/2023) for physical or sooner if needed.Schedule b4 you leave.

## 2022-08-28 ENCOUNTER — Encounter: Payer: Self-pay | Admitting: Family Medicine

## 2022-08-30 ENCOUNTER — Ambulatory Visit
Admission: RE | Admit: 2022-08-30 | Discharge: 2022-08-30 | Disposition: A | Discharge status: Home / Self Care | Payer: Medicare Other | Source: Ambulatory Visit | Attending: Family Medicine | Admitting: Family Medicine

## 2022-08-30 DIAGNOSIS — Z1231 Encounter for screening mammogram for malignant neoplasm of breast: Secondary | ICD-10-CM

## 2022-09-18 ENCOUNTER — Encounter: Payer: Self-pay | Admitting: Family Medicine

## 2022-10-07 ENCOUNTER — Ambulatory Visit: Payer: Medicare Other

## 2022-10-17 ENCOUNTER — Ambulatory Visit (INDEPENDENT_AMBULATORY_CARE_PROVIDER_SITE_OTHER): Payer: Medicare Other

## 2022-10-17 VITALS — Wt 110.0 lb

## 2022-10-17 DIAGNOSIS — Z0000 Encounter for general adult medical examination without abnormal findings: Secondary | ICD-10-CM | POA: Diagnosis not present

## 2022-10-17 NOTE — Progress Notes (Signed)
I connected with  Crystal [name] on 10/17/22 by a audio enabled telemedicine application and verified that I am speaking with the correct person using two identifiers.  Patient Location: Home  Provider Location: Office/Clinic  I discussed the limitations of evaluation and management by telemedicine. The patient expressed understanding and agreed to proceed.   Subjective:   Crystal [name] is a 66 y.o. female who presents for Medicare Annual (Subsequent) preventive examination.  Review of Systems     Cardiac Risk Factors include: advanced age (>56mn, >>7women);dyslipidemia     Objective:    Today's Vitals   10/17/22 1008  Weight: 110 lb (49.9 kg)   Body mass index is 20.45 kg/m.     10/17/2022   10:13 AM 09/21/2021   10:53 AM 02/07/2020    8:50 AM 12/27/2019    7:31 AM 11/15/2019    9:46 AM 10/11/2019    2:54 PM  Advanced Directives  Does Patient Have a Medical Advance Directive? Yes Yes Yes No Yes Yes  Type of AParamedicof AElramaLiving will HJuniataLiving will HMantuaLiving will  HNageeziLiving will HHammontonLiving will  Does patient want to make changes to medical advance directive? No - Patient declined       Copy of H[place]urel Hillin Chart? Yes - validated most recent copy scanned in chart (See row information) No - copy requested Yes - validated most recent copy scanned in chart (See row information)  No - copy requested   Would patient like information on creating a medical advance directive? No - Patient declined   No - Patient declined      Current Medications (verified) Outpatient Encounter Medications as of 10/17/2022  Medication Sig   Calcium Carb-Cholecalciferol (CALCIUM 600+D3 PO) Take 1 tablet by mouth daily.   Cyanocobalamin (VITAMIN B-12 PO) Place 1 mL under the tongue daily.    omeprazole (PRILOSEC) 40 MG capsule TAKE 1 CAPSULE  (40 MG TOTAL) BY MOUTH DAILY.   [DISCONTINUED] acyclovir cream (ZOVIRAX) 5 % Apply to affected area 5x daily for 4 days.   [DISCONTINUED] triamcinolone cream (KENALOG) 0.1 % Apply 1 application topically 2 (two) times daily. For 7-10 days maximum   No facility-administered encounter medications on file as of 10/17/2022.    Allergies (verified) Adhesive [tape], E-mycin [erythromycin base], and Silicone   History: Past Medical History:  Diagnosis Date   Complication of anesthesia    kept a severe HA for 10 days after     Esophageal stenosis    Graves' disease 1985   treated with PTU   Hyperlipidemia    Insomnia    Osteoporosis    Wears glasses    White coat hypertension    Past Surgical History:  Procedure Laterality Date   BIOPSY  10/11/2019   Procedure: BIOPSY;  Surgeon: GGatha Mayer MD;  Location: [facility];  Service: Endoscopy;;   COLONOSCOPY     ESOPHAGEAL DILATION  10/11/2019   Procedure: ESOPHAGEAL DILATION;  Surgeon: GGatha Mayer MD;  [facility]: [facility];  Service: Endoscopy;;   ESOPHAGOGASTRODUODENOSCOPY N/A 10/11/2019   Procedure: ESOPHAGOGASTRODUODENOSCOPY (EGD);  Surgeon: GGatha Mayer MD;  [facility];  Service: Endoscopy;  Laterality: N/A;   ESOPHAGOGASTRODUODENOSCOPY (EGD) WITH PROPOFOL N/A 11/15/2019   Procedure: ESOPHAGOGASTRODUODENOSCOPY (EGD) WITH PROPOFOL With savary dilation, need fluroscopy;  Surgeon: PIrene Shipper MD;  [facility];  Service: Endoscopy;  Laterality: N/A;  ESOPHAGOGASTRODUODENOSCOPY (EGD) WITH PROPOFOL N/A 12/27/2019   Procedure: ESOPHAGOGASTRODUODENOSCOPY (EGD) WITH PROPOFOL;  Surgeon: Irene Shipper, MD;  [facility]: [facility];  Service: Endoscopy;  Laterality: N/A;   ESOPHAGOGASTRODUODENOSCOPY (EGD) WITH PROPOFOL N/A 02/07/2020   Procedure: ESOPHAGOGASTRODUODENOSCOPY (EGD) WITH PROPOFOL;  Surgeon: Irene Shipper, MD;  [facility];  Service: Endoscopy;  Laterality: N/A;   FOREIGN BODY REMOVAL   10/11/2019   Procedure: FOREIGN BODY REMOVAL;  Surgeon: Gatha Mayer, MD;  Location: [facility];  Service: Endoscopy;;   SAVORY DILATION N/A 11/15/2019   Procedure: Azzie Almas DILATION;  Surgeon: Irene Shipper, MD;  [facility];  Service: Endoscopy;  Laterality: N/A;   SAVORY DILATION N/A 12/27/2019   Procedure: SAVORY DILATION;  Surgeon: Irene Shipper, MD;  Location[facility];  Service: Endoscopy;  Laterality: N/A;  Needs Fluroscopy   SAVORY DILATION N/A 02/07/2020   Procedure: SAVORY DILATION;  Surgeon: Irene Shipper, MD;  Location: [facility];  Service: Endoscopy;  Laterality: N/A;  Need Fluro   Family History  Problem Relation Age of Onset   Dementia Mother    Heart disease Mother 43       sudden cardiac---?? heart block   Heart disease Father        CABG x2, apparently 31rd open heart surgery.    Heart attack Father    Stroke Father        84 massive stroke led to passing.   Stroke Sister        age 58. healthy prior to this- [place]   Hypertension Sister    Atrial fibrillation Sister    Colon cancer Neg Hx    Stomach cancer Neg Hx    Rectal cancer Neg Hx    Pancreatic cancer Neg Hx    Esophageal cancer Neg Hx    Social History   Socioeconomic History   Marital status: Married    Spouse name: Not on file   Number of children: Not on file   Years of education: Not on file   Highest education level: Not on file  Occupational History   Not on file  Tobacco Use   Smoking status: Never   Smokeless tobacco: Never  Vaping Use   Vaping Use: Never used  Substance and Sexual Activity   Alcohol use: Yes    Alcohol/week: 14.0 standard drinks of alcohol    Types: 14 Glasses of wine per week   Drug use: No   Sexual activity: Not on file  Other Topics Concern   Not on file  Social History Narrative   Married for 26 years. No kids. 4 cats (lost one 06/2014, another in 2016). 1 cat in chemo in 2017 for 3 years      Retired finally December 2020.    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       Socializes way too little reportedly   Loves to exercise, enjoys the [place] [employer] [place] boating, has place in [place]. Thinking about retirement by age 16.   Eats healthy-kale, spinach beans   Social Determinants of Health   Financial Resource Strain: Low Risk  (10/17/2022)   Overall Financial Resource Strain (CARDIA)    Difficulty of Paying Living Expenses: Not hard at all  Food Insecurity: No Food Insecurity (10/17/2022)   Hunger Vital Sign  Worried About Charity fundraiser in the Last Year: Never true    [place] in the Last Year: Never true  Transportation Needs: No Transportation Needs (10/17/2022)   PRAPARE - Hydrologist (Medical): No    Lack of Transportation (Non-Medical): No  Physical Activity: Sufficiently Active (10/17/2022)   Exercise Vital Sign    Days of Exercise per Week: 5 days    Minutes of Exercise per Session: 120 min  Stress: No Stress Concern Present (10/17/2022)   [place]    Feeling of Stress : Not at all  Social Connections: Moderately Integrated (10/17/2022)   Social Connection and Isolation Panel [NHANES]    Frequency of Communication with Friends and Family: Once a week    Frequency of Social Gatherings with Friends and Family: More than three times a week    Attends Religious Services: Never    Marine scientist or Organizations: Yes    Attends Music therapist: 1 to 4 times per year    Marital Status: Married    Tobacco Counseling Counseling given: Not Answered   Clinical Intake:  Pre-visit preparation completed: Yes  Pain : No/denies pain     BMI - recorded: 20.45 Nutritional Status: BMI of 19-24  Normal Nutritional Risks:  None Diabetes: No  How often do you need to have someone help you when you read instructions, pamphlets, or other written materials from your doctor or pharmacy?: 1 - Never  Diabetic?no  Interpreter Needed?: No  Information entered by :: Charlott Rakes, LPN   Activities of Daily Living    10/17/2022   10:15 AM  In your present state of health, do you have any difficulty performing the following activities:  Hearing? 0  Vision? 0  Difficulty concentrating or making decisions? 0  Walking or climbing stairs? 0  Dressing or bathing? 0  Doing errands, shopping? 0  Preparing Food and eating ? N  Using the Toilet? N  In the past six months, have you accidently leaked urine? N  Do you have problems with loss of bowel control? N  Managing your Medications? N  Managing your Finances? N  Housekeeping or managing your Housekeeping? N    Patient Care Team: Marin Olp, MD as PCP - General (Family Medicine)  Indicate any recent Medical Services you may have received from other than [facility] providers in the past year (date may be approximate).     Assessment:   This is a routine wellness examination for [place].  Hearing/Vision screen Hearing Screening - Comments:: Pt denies any hearing issues  Vision Screening - Comments:: Pt follows up with Dr Katy Apo   Dietary issues and exercise activities discussed: Current Exercise Habits: Home exercise routine, Type of exercise: walking;Other - see comments, Time (Minutes): > 60, Frequency (Times/Week): 5, Weekly Exercise (Minutes/Week): 0   Goals Addressed             This Visit's Progress    Patient Stated       Getting back to gym        Depression Screen    10/17/2022   10:11 AM 09/21/2021   10:51 AM 08/20/2021    9:38 AM 05/18/2021   10:34 AM 08/14/2020   10:19 AM 08/09/2019   11:04 AM 01/15/2018    9:34 AM  PHQ 2/9 Scores  PHQ - 2 Score 0 0 0 0 0 0 0  PHQ- 9 Score    0 0      Fall Risk    10/17/2022    10:14 AM 09/21/2021   10:54 AM 08/20/2021    9:38 AM 05/18/2021   10:34 AM 08/14/2020   10:18 AM  Fall Risk   Falls in the past year? 0 '1 1 1 '$ 0  Number falls in past yr: 0 1 0 0 0  Injury with Fall? 0 0 0 0 0  Risk for fall due to : No Fall Risks Impaired vision No Fall Risks No Fall Risks   Follow up Falls prevention discussed Falls prevention discussed Falls evaluation completed      FALL RISK PREVENTION PERTAINING TO THE HOME:  Any stairs in or around the home? Yes  If so, are there any without handrails? No  Home free of loose throw rugs in walkways, pet beds, electrical cords, etc? Yes  Adequate lighting in your home to reduce risk of falls? Yes   ASSISTIVE DEVICES UTILIZED TO PREVENT FALLS:  Life alert? No  Use of a cane, walker or w/c? No  Grab bars in the bathroom? No  Shower chair or bench in shower? No  Elevated toilet seat or a handicapped toilet? No   TIMED UP AND GO:  Was the test performed? No .   Cognitive Function:        10/17/2022   10:15 AM 09/21/2021   10:55 AM  6CIT Screen  What Year? 0 points 0 points  What month? 0 points 0 points  What time? 0 points 0 points  Count back from 20 0 points 0 points  Months in reverse 0 points 0 points  Repeat phrase 0 points 0 points  Total Score 0 points 0 points    Immunizations Immunization History  Administered Date(s) Administered   Fluad Quad(high Dose 65+) 08/20/2021, 08/27/2022   Influenza Split 10/21/2011, 10/26/2012   Influenza, High Dose Seasonal PF 08/27/2022   Influenza,inj,Quad PF,6+ Mos 09/29/2013, 09/06/2014, 10/24/2015, 09/24/2016, 10/07/2017, 10/01/2018, 09/07/2019, 10/03/2020   Moderna SARS-COV2 Booster Vaccination 09/12/2022   Moderna Sars-Covid-2 Vaccination 03/08/2020, 04/05/2020, 11/28/2020   PFIZER Comirnaty(Gray Top)Covid-19 Tri-Sucrose Vaccine 09/18/2022   PNEUMOCOCCAL CONJUGATE-20 08/20/2021   Tdap 04/20/2012, 08/27/2022   Zoster Recombinat (Shingrix) 12/17/2018, 03/02/2019     TDAP status: Up to date  Flu Vaccine status: Up to date  Pneumococcal vaccine status: Up to date  Covid-19 vaccine status: Completed vaccines  Qualifies for Shingles Vaccine? Yes   Zostavax completed Yes   Shingrix Completed?: Yes  Screening Tests Health Maintenance  Topic Date Due   Hepatitis C Screening  12/28/2049 (Originally 05/22/1974)   COVID-19 Vaccine (4 - Moderna series) 11/13/2022   Medicare Annual Wellness (AWV)  11/17/2023   MAMMOGRAM  08/30/2024   COLONOSCOPY (Pts 45-36yr Insurance coverage will need to be confirmed)  09/15/2031   TETANUS/TDAP  08/27/2032   Pneumonia Vaccine 66 Years old  Completed   INFLUENZA VACCINE  Completed   DEXA SCAN  Completed   Zoster Vaccines- Shingrix  Completed   HPV VACCINES  Aged Out    Health Maintenance  There are no preventive care reminders to display for this patient.   Colorectal cancer screening: Type of screening: Colonoscopy. Completed 09/14/21. Repeat every 10 years  Mammogram status: Completed 08/30/22. Repeat every year  Bone Density status: Completed 10/03/21. Results reflect: Bone density results: OSTEOPOROSIS. Repeat every 2 years.   Additional Screening:  Hepatitis C Screening: does qualify;  Vision Screening: Recommended annual ophthalmology exams for  early detection of glaucoma and other disorders of the eye. Is the patient up to date with their annual eye exam?  Yes  Who is the provider or what is the name of the office in which the patient attends annual eye exams? Dr Prudencio Burly If pt is not established with a provider, would they like to be referred to a provider to establish care? No .   Dental Screening: Recommended annual dental exams for proper oral hygiene  Community Resource Referral / Chronic Care Management: CRR required this visit?  No   CCM required this visit?  No      Plan:     I have personally reviewed and noted the following in the patient's chart:   Medical and social  history Use of alcohol, tobacco or illicit drugs  Current medications and supplements including opioid prescriptions. Patient is not currently taking opioid prescriptions. Functional ability and status Nutritional status Physical activity Advanced directives List of other physicians Hospitalizations, surgeries, and ER visits in previous 12 months Vitals Screenings to include cognitive, depression, and falls Referrals and appointments  In addition, I have reviewed and discussed with patient certain preventive protocols, quality metrics, and best practice recommendations. A written personalized care plan for preventive services as well as general preventive health recommendations were provided to patient.     Willette Brace, LPN   [id number]   Nurse Notes: none

## 2022-10-17 NOTE — Patient Instructions (Signed)
Crystal [name] , Thank you for taking time to come for your Medicare Wellness Visit. I appreciate your ongoing commitment to your health goals. Please review the following plan we discussed and let me know if I can assist you in the future.   These are the goals we discussed:  Goals      Patient Stated     None at this time      Patient Stated     Getting back to gym         This is a list of the screening recommended for you and due dates:  Health Maintenance  Topic Date Due   Hepatitis C Screening: USPSTF Recommendation to screen - Ages 18-79 yo.  12/28/2049*   COVID-19 Vaccine (4 - Moderna series) 11/13/2022   Medicare Annual Wellness Visit  11/17/2023   Mammogram  08/30/2024   Colon Cancer Screening  09/15/2031   Tetanus Vaccine  08/27/2032   Pneumonia Vaccine  Completed   Flu Shot  Completed   DEXA scan (bone density measurement)  Completed   Zoster (Shingles) Vaccine  Completed   HPV Vaccine  Aged Out  *Topic was postponed. The date shown is not the original due date.    Advanced directives: copies in chart   Conditions/risks identified: get back to the gym   Next appointment: Follow up in one year for your annual wellness visit    Preventive Care 65 Years and Older, Female Preventive care refers to lifestyle choices and visits with your health care provider that can promote health and wellness. What does preventive care include? A yearly physical exam. This is also called an annual well check. Dental exams once or twice a year. Routine eye exams. Ask your health care provider how often you should have your eyes checked. Personal lifestyle choices, including: Daily care of your teeth and gums. Regular physical activity. Eating a healthy diet. Avoiding tobacco and drug use. Limiting alcohol use. Practicing safe sex. Taking low-dose aspirin every day. Taking vitamin and mineral supplements as recommended by your health care provider. What happens during an annual  well check? The services and screenings done by your health care provider during your annual well check will depend on your age, overall health, lifestyle risk factors, and family history of disease. Counseling  Your health care provider may ask you questions about your: Alcohol use. Tobacco use. Drug use. Emotional well-being. Home and relationship well-being. Sexual activity. Eating habits. History of falls. Memory and ability to understand (cognition). Work and work Statistician. Reproductive health. Screening  You may have the following tests or measurements: Height, weight, and BMI. Blood pressure. Lipid and cholesterol levels. These may be checked every 5 years, or more frequently if you are over 12 years old. Skin check. Lung cancer screening. You may have this screening every year starting at age 58 if you have a 30-pack-year history of smoking and currently smoke or have quit within the past 15 years. Fecal occult blood test (FOBT) of the stool. You may have this test every year starting at age 63. Flexible sigmoidoscopy or colonoscopy. You may have a sigmoidoscopy every 5 years or a colonoscopy every 10 years starting at age 85. Hepatitis C blood test. Hepatitis B blood test. Sexually transmitted disease (STD) testing. Diabetes screening. This is done by checking your blood sugar (glucose) after you have not eaten for a while (fasting). You may have this done every 1-3 years. Bone density scan. This is done to screen for osteoporosis.  You may have this done starting at age 57. Mammogram. This may be done every 1-2 years. Talk to your health care provider about how often you should have regular mammograms. Talk with your health care provider about your test results, treatment options, and if necessary, the need for more tests. Vaccines  Your health care provider may recommend certain vaccines, such as: Influenza vaccine. This is recommended every year. Tetanus, diphtheria,  and acellular pertussis (Tdap, Td) vaccine. You may need a Td booster every 10 years. Zoster vaccine. You may need this after age 23. Pneumococcal 13-valent conjugate (PCV13) vaccine. One dose is recommended after age 6. Pneumococcal polysaccharide (PPSV23) vaccine. One dose is recommended after age 17. Talk to your health care provider about which screenings and vaccines you need and how often you need them. This information is not intended to replace advice given to you by your health care provider. Make sure you discuss any questions you have with your health care provider. Document Released: 01/05/2016 Document Revised: 08/28/2016 Document Reviewed: 10/10/2015 Elsevier Interactive Patient Education  2017 [place] Prevention in the Home Falls can cause injuries. They can happen to people of all ages. There are many things you can do to make your home safe and to help prevent falls. What can I do on the outside of my home? Regularly fix the edges of walkways and driveways and fix any cracks. Remove anything that might make you trip as you walk through a door, such as a raised step or threshold. Trim any bushes or trees on the path to your home. Use bright outdoor lighting. Clear any walking paths of anything that might make someone trip, such as rocks or tools. Regularly check to see if handrails are loose or broken. Make sure that both sides of any steps have handrails. Any raised decks and porches should have guardrails on the edges. Have any leaves, snow, or ice cleared regularly. Use sand or salt on walking paths during winter. Clean up any spills in your garage right away. This includes oil or grease spills. What can I do in the bathroom? Use night lights. Install grab bars by the toilet and in the tub and shower. Do not use towel bars as grab bars. Use non-skid mats or decals in the tub or shower. If you need to sit down in the shower, use a plastic, non-slip  stool. Keep the floor dry. Clean up any water that spills on the floor as soon as it happens. Remove soap buildup in the tub or shower regularly. Attach bath mats securely with double-sided non-slip rug tape. Do not have throw rugs and other things on the floor that can make you trip. What can I do in the bedroom? Use night lights. Make sure that you have a light by your bed that is easy to reach. Do not use any sheets or blankets that are too big for your bed. They should not hang down onto the floor. Have a firm chair that has side arms. You can use this for support while you get dressed. Do not have throw rugs and other things on the floor that can make you trip. What can I do in the kitchen? Clean up any spills right away. Avoid walking on wet floors. Keep items that you use a lot in easy-to-reach places. If you need to reach something above you, use a strong step stool that has a grab bar. Keep electrical cords out of the way. Do not use  floor polish or wax that makes floors slippery. If you must use wax, use non-skid floor wax. Do not have throw rugs and other things on the floor that can make you trip. What can I do with my stairs? Do not leave any items on the stairs. Make sure that there are handrails on both sides of the stairs and use them. Fix handrails that are broken or loose. Make sure that handrails are as long as the stairways. Check any carpeting to make sure that it is firmly attached to the stairs. Fix any carpet that is loose or worn. Avoid having throw rugs at the top or bottom of the stairs. If you do have throw rugs, attach them to the floor with carpet tape. Make sure that you have a light switch at the top of the stairs and the bottom of the stairs. If you do not have them, ask someone to add them for you. What else can I do to help prevent falls? Wear shoes that: Do not have high heels. Have rubber bottoms. Are comfortable and fit you well. Are closed at the  toe. Do not wear sandals. If you use a stepladder: Make sure that it is fully opened. Do not climb a closed stepladder. Make sure that both sides of the stepladder are locked into place. Ask someone to hold it for you, if possible. Clearly mark and make sure that you can see: Any grab bars or handrails. First and last steps. Where the edge of each step is. Use tools that help you move around (mobility aids) if they are needed. These include: Canes. Walkers. Scooters. Crutches. Turn on the lights when you go into a dark area. Replace any light bulbs as soon as they burn out. Set up your furniture so you have a clear path. Avoid moving your furniture around. If any of your floors are uneven, fix them. If there are any pets around you, be aware of where they are. Review your medicines with your doctor. Some medicines can make you feel dizzy. This can increase your chance of falling. Ask your doctor what other things that you can do to help prevent falls. This information is not intended to replace advice given to you by your health care provider. Make sure you discuss any questions you have with your health care provider. Document Released: 10/05/2009 Document Revised: 05/16/2016 Document Reviewed: 01/13/2015 Elsevier Interactive Patient Education  2017 [employer].

## 2022-11-13 ENCOUNTER — Other Ambulatory Visit: Payer: Self-pay | Admitting: Internal Medicine

## 2023-02-21 ENCOUNTER — Other Ambulatory Visit: Payer: Self-pay | Admitting: Internal Medicine

## 2023-05-27 ENCOUNTER — Ambulatory Visit: Payer: Medicare Other | Admitting: Internal Medicine

## 2023-05-27 ENCOUNTER — Encounter: Payer: Self-pay | Admitting: Internal Medicine

## 2023-05-27 VITALS — BP 132/74 | HR 75 | Ht 61.5 in | Wt 111.0 lb

## 2023-05-27 DIAGNOSIS — K222 Esophageal obstruction: Secondary | ICD-10-CM | POA: Diagnosis not present

## 2023-05-27 DIAGNOSIS — K219 Gastro-esophageal reflux disease without esophagitis: Secondary | ICD-10-CM

## 2023-05-27 DIAGNOSIS — Z1211 Encounter for screening for malignant neoplasm of colon: Secondary | ICD-10-CM | POA: Diagnosis not present

## 2023-05-27 MED ORDER — OMEPRAZOLE 40 MG PO CPDR: 40.0000 mg | DELAYED_RELEASE_CAPSULE | Freq: Every day | ORAL | 2 refills | Status: DC

## 2023-05-27 NOTE — Progress Notes (Signed)
HISTORY OF PRESENT ILLNESS:  Crystal [name] is a 67 y.o. female, wife of Kennedy Bucker, with GERD complicated by high-grade esophageal stricturing which required multiple esophageal dilations. Patient has also had a previous food impaction. She has phenotypic EOE type esophagus. Last esophageal dilation February 2021 up to 17 mm with Savary dilator.  She was last seen in the office July 12, 2021.  No dysphagia at that time.  She was continued on omeprazole 40 mg daily.  She subsequently underwent routine screening colonoscopy September 2022.  Examination was normal except for incidental sigmoid diverticulosis.  No neoplasia.  Follow-up in 10 years recommended.  She presents today for routine follow-up regarding her complicated GERD.  She continues on omeprazole 40 mg daily.  No reflux symptoms.  Though she denies dysphagia, she tells me that she chews her food very very carefully "out of fear".  She does request medication refill.  She asked me if there are other treatments or therapies for her esophageal condition    REVIEW OF SYSTEMS:  All non-GI ROS negative unless otherwise stated in the HPI.  Past Medical History:  Diagnosis Date   Complication of anesthesia    kept a severe HA for 10 days after     Esophageal stenosis    Graves' disease 1985   treated with PTU   Hyperlipidemia    Insomnia    Osteoporosis    Wears glasses    White coat hypertension     Past Surgical History:  Procedure Laterality Date   BIOPSY  10/11/2019   Procedure: BIOPSY;  Surgeon: Iva Boop, MD;  Location: [facility];  Service: Endoscopy;;   COLONOSCOPY     ESOPHAGEAL DILATION  10/11/2019   Procedure: ESOPHAGEAL DILATION;  Surgeon: Iva Boop, MD;  [facility];  Service: Endoscopy;;   ESOPHAGOGASTRODUODENOSCOPY N/A 10/11/2019   Procedure: ESOPHAGOGASTRODUODENOSCOPY (EGD);  Surgeon: Iva Boop, MD;  Location: [facility];  Service: Endoscopy;  Laterality: N/A;    ESOPHAGOGASTRODUODENOSCOPY (EGD) WITH PROPOFOL N/A 11/15/2019   Procedure: ESOPHAGOGASTRODUODENOSCOPY (EGD) WITH PROPOFOL With savary dilation, need fluroscopy;  Surgeon: Hilarie Fredrickson, MD;  [facility];  Service: Endoscopy;  Laterality: N/A;   ESOPHAGOGASTRODUODENOSCOPY (EGD) WITH PROPOFOL N/A 12/27/2019   Procedure: ESOPHAGOGASTRODUODENOSCOPY (EGD) WITH PROPOFOL;  Surgeon: Hilarie Fredrickson, MD;  [facility];  Service: Endoscopy;  Laterality: N/A;   ESOPHAGOGASTRODUODENOSCOPY (EGD) WITH PROPOFOL N/A 02/07/2020   Procedure: ESOPHAGOGASTRODUODENOSCOPY (EGD) WITH PROPOFOL;  Surgeon: Hilarie Fredrickson, MD;  [facility];  Service: Endoscopy;  Laterality: N/A;   FOREIGN BODY REMOVAL  10/11/2019   Procedure: FOREIGN BODY REMOVAL;  Surgeon: Iva Boop, MD;  [facility];  Service: Endoscopy;;   SAVORY DILATION N/A 11/15/2019   Procedure: Gaspar Bidding DILATION;  Surgeon: Hilarie Fredrickson, MD;  [facility];  Service: Endoscopy;  Laterality: N/A;   SAVORY DILATION N/A 12/27/2019   Procedure: SAVORY DILATION;  Surgeon: Hilarie Fredrickson, MD;  [facility];  Service: Endoscopy;  Laterality: N/A;  Needs Fluroscopy   SAVORY DILATION N/A 02/07/2020   Procedure: SAVORY DILATION;  Surgeon: Hilarie Fredrickson, MD;  [facility];  Service: Endoscopy;  Laterality: N/A;  Need Fluro    Social History Crystal [name]  reports that she has never smoked. She has never used smokeless tobacco. She reports current alcohol use of about 14.0 standard drinks of alcohol per week. She reports that she does not use drugs.  family history includes Atrial fibrillation in  her sister; Dementia in her mother; Heart attack in her father; Heart disease in her father; Heart disease (age of onset: 34) in her mother; Hypertension in her sister; Stroke in her father and sister.  Allergies  Allergen Reactions   Adhesive [Tape] Swelling   E-Mycin [Erythromycin Base] Swelling   Silicone  Rash    Patient states that she has some silicone glasses that Breaks her face out. No other glasses does that to her.        PHYSICAL EXAMINATION: Vital signs: BP 132/74   Pulse 75   Ht 5' 1.5" (1.562 m)   Wt 111 lb (50.3 kg)   BMI 20.63 kg/m   Constitutional: generally well-appearing, no acute distress Psychiatric: alert and oriented x3, cooperative Eyes: extraocular movements intact, anicteric, conjunctiva pink Mouth: oral pharynx moist, no lesions Neck: supple no lymphadenopathy Cardiovascular: heart regular rate and rhythm, soft systolic murmur Lungs: clear to auscultation bilaterally Abdomen: soft, nontender, nondistended, no obvious ascites, no peritoneal signs, normal bowel sounds, no organomegaly Rectal: Extremities: no lower extremity edema bilaterally Skin: no lesions on visible extremities Neuro: No focal deficits. No asterixis.    ASSESSMENT:  1.  GERD complicated by high-grade peptic stricture requiring repeat esophageal dilation.  No active reflux symptoms.  She denies dysphagia but states "she chews her food very carefully". 2.  Negative screening colonoscopy September 2022   PLAN:  1.  Refill omeprazole 40 mg daily.  Medication list reviewed 2.  Reflux precautions 3.  Very specifically instructed to contact the office should she start noticing any significant dysphagia.  As such, she would require repeat esophageal dilation to avoid acute food impaction, as in the past.  She understands. 4.  Otherwise, routine office follow-up 1 year 5.  Screening colonoscopy around September 2032  30 minutes was spent preparing to see the patient, obtaining interval history, performing medically appropriate physical exam, counseling educating the patient regarding the above listed issues, ordering medication, defining follow-up intervals, and documenting clinical information in the health record.

## 2023-05-27 NOTE — Patient Instructions (Signed)
We have sent the following medications to your pharmacy for you to pick up at your convenience:  Omeprazole.  Please follow up in one year.  _______________________________________________________  If your blood pressure at your visit was 140/90 or greater, please contact your primary care physician to follow up on this.  _______________________________________________________  If you are age 67 or older, your body mass index should be between 23-30. Your Body mass index is 20.63 kg/m. If this is out of the aforementioned range listed, please consider follow up with your Primary Care Provider.  If you are age 104 or younger, your body mass index should be between 19-25. Your Body mass index is 20.63 kg/m. If this is out of the aformentioned range listed, please consider follow up with your Primary Care Provider.   ________________________________________________________  The [facility] providers would like to encourage you to use [facility] to communicate with providers for non-urgent requests or questions.  Due to long hold times on the telephone, sending your provider a message by [facility] may be a faster and more efficient way to get a response.  Please allow 48 business hours for a response.  Please remember that this is for non-urgent requests.  _______________________________________________________

## 2023-08-01 ENCOUNTER — Other Ambulatory Visit: Payer: Self-pay | Admitting: Family Medicine

## 2023-08-01 DIAGNOSIS — Z1231 Encounter for screening mammogram for malignant neoplasm of breast: Secondary | ICD-10-CM

## 2023-08-07 ENCOUNTER — Other Ambulatory Visit: Payer: Self-pay | Admitting: Internal Medicine

## 2023-08-07 ENCOUNTER — Encounter (INDEPENDENT_AMBULATORY_CARE_PROVIDER_SITE_OTHER): Payer: Self-pay

## 2023-08-21 ENCOUNTER — Encounter: Payer: Self-pay | Admitting: Family Medicine

## 2023-08-26 ENCOUNTER — Other Ambulatory Visit: Payer: Self-pay | Admitting: Family Medicine

## 2023-08-26 ENCOUNTER — Encounter: Payer: Self-pay | Admitting: Family Medicine

## 2023-08-26 ENCOUNTER — Ambulatory Visit (INDEPENDENT_AMBULATORY_CARE_PROVIDER_SITE_OTHER): Payer: Medicare Other | Admitting: Family Medicine

## 2023-08-26 VITALS — BP 129/73 | HR 75 | Temp 97.0°F | Ht 61.5 in | Wt 111.8 lb

## 2023-08-26 DIAGNOSIS — E079 Disorder of thyroid, unspecified: Secondary | ICD-10-CM

## 2023-08-26 DIAGNOSIS — Z0000 Encounter for general adult medical examination without abnormal findings: Secondary | ICD-10-CM

## 2023-08-26 DIAGNOSIS — E785 Hyperlipidemia, unspecified: Secondary | ICD-10-CM | POA: Diagnosis not present

## 2023-08-26 DIAGNOSIS — E538 Deficiency of other specified B group vitamins: Secondary | ICD-10-CM

## 2023-08-26 DIAGNOSIS — Z131 Encounter for screening for diabetes mellitus: Secondary | ICD-10-CM

## 2023-08-26 DIAGNOSIS — Z23 Encounter for immunization: Secondary | ICD-10-CM

## 2023-08-26 DIAGNOSIS — R739 Hyperglycemia, unspecified: Secondary | ICD-10-CM

## 2023-08-26 DIAGNOSIS — M810 Age-related osteoporosis without current pathological fracture: Secondary | ICD-10-CM

## 2023-08-26 LAB — CBC WITH DIFFERENTIAL/PLATELET
Basophils Absolute: 0.1 10*3/uL (ref 0.0–0.1)
Basophils Relative: 1.2 % (ref 0.0–3.0)
Eosinophils Absolute: 0.1 10*3/uL (ref 0.0–0.7)
Eosinophils Relative: 0.9 % (ref 0.0–5.0)
HCT: 40.4 % (ref 36.0–46.0)
Hemoglobin: 13.2 g/dL (ref 12.0–15.0)
Lymphocytes Relative: 20.6 % (ref 12.0–46.0)
Lymphs Abs: 1.6 10*3/uL (ref 0.7–4.0)
MCHC: 32.8 g/dL (ref 30.0–36.0)
MCV: 101 fl — ABNORMAL HIGH (ref 78.0–100.0)
Monocytes Absolute: 0.7 10*3/uL (ref 0.1–1.0)
Monocytes Relative: 9.7 % (ref 3.0–12.0)
Neutro Abs: 5.1 10*3/uL (ref 1.4–7.7)
Neutrophils Relative %: 67.6 % (ref 43.0–77.0)
Platelets: 245 10*3/uL (ref 150.0–400.0)
RBC: 3.99 Mil/uL (ref 3.87–5.11)
RDW: 13.6 % (ref 11.5–15.5)
WBC: 7.6 10*3/uL (ref 4.0–10.5)

## 2023-08-26 LAB — COMPREHENSIVE METABOLIC PANEL
ALT: 17 U/L (ref 0–35)
AST: 29 U/L (ref 0–37)
Albumin: 4.4 g/dL (ref 3.5–5.2)
Alkaline Phosphatase: 74 U/L (ref 39–117)
BUN: 14 mg/dL (ref 6–23)
CO2: 28 meq/L (ref 19–32)
Calcium: 9.6 mg/dL (ref 8.4–10.5)
Chloride: 100 meq/L (ref 96–112)
Creatinine, Ser: 0.69 mg/dL (ref 0.40–1.20)
GFR: 89.9 mL/min (ref >60.00)
Glucose, Bld: 101 mg/dL — ABNORMAL HIGH (ref 70–99)
Potassium: 4.1 meq/L (ref 3.5–5.1)
Sodium: 139 meq/L (ref 135–145)
Total Bilirubin: 0.5 mg/dL (ref 0.2–1.2)
Total Protein: 7.9 g/dL (ref 6.0–8.3)

## 2023-08-26 LAB — LIPID PANEL
Cholesterol: 300 mg/dL — ABNORMAL HIGH (ref 0–200)
HDL: 95.9 mg/dL (ref >39.00)
LDL Cholesterol: 189 mg/dL — ABNORMAL HIGH (ref 0–99)
NonHDL: 203.72
Total CHOL/HDL Ratio: 3
Triglycerides: 76 mg/dL (ref 0.0–149.0)
VLDL: 15.2 mg/dL (ref 0.0–40.0)

## 2023-08-26 LAB — HEMOGLOBIN A1C: Hgb A1c MFr Bld: 5.9 % (ref 4.6–6.5)

## 2023-08-26 NOTE — Patient Instructions (Addendum)
Health Maintenance Due  Topic Date Due   Medicare Annual Wellness (AWV)  10/18/2023  You are eligible to schedule your annual wellness visit with our nurse specialist Inetta Fermo.  Please consider scheduling this before you leave today  Please stop by lab before you go If you have mychart- we will send your results within 3 business days of [place] receiving them.  If you do not have mychart- we will call you about results within 5 business days of [place] receiving them.  *please also note that you will see labs on mychart as soon as they post. I will later go in and write notes on them- will say "notes from Dr. Durene Cal"   Call breast center and see if they can add the bone density  Great job monitoring blood pressure - if you see average over 140/90 lets reconsider medicine or could try DASH eating plan  Recommended follow up: Return in about 1 year (around 08/25/2024) for physical or sooner if needed.Schedule b4 you leave.

## 2023-08-26 NOTE — Progress Notes (Signed)
Phone [phone]   Subjective:  Patient presents today for their annual physical. Chief complaint-noted.   See problem oriented charting- ROS- full  review of systems was completed and negative Per full ROS sheet completed by patient  The following were reviewed and entered/updated in epic: Past Medical History:  Diagnosis Date   Complication of anesthesia    kept a severe HA for 10 days after     Esophageal stenosis    Graves' disease 1985   treated with PTU   Hyperlipidemia    Insomnia    Osteoporosis    Wears glasses    White coat hypertension    Patient Active Problem List   Diagnosis Date Noted   Osteoporosis 07/26/2014    Priority: High   Hyperglycemia 08/22/2022    Priority: Medium    B12 deficiency 08/22/2022    Priority: Medium    Esophageal stricture     Priority: Medium    Hyperlipidemia 01/15/2018    Priority: Medium    Elevated blood pressure reading in office with white coat syndrome, without diagnosis of hypertension 07/26/2014    Priority: Medium    Thyroid disease     Priority: Medium    Past Surgical History:  Procedure Laterality Date   BIOPSY  10/11/2019   Procedure: BIOPSY;  Surgeon: Iva Boop, MD;  [facility];  Service: Endoscopy;;   COLONOSCOPY     ESOPHAGEAL DILATION  10/11/2019   Procedure: ESOPHAGEAL DILATION;  Surgeon: Iva Boop, MD;  [facility];  Service: Endoscopy;;   ESOPHAGOGASTRODUODENOSCOPY N/A 10/11/2019   Procedure: ESOPHAGOGASTRODUODENOSCOPY (EGD);  Surgeon: Iva Boop, MD;  Location: [facility];  Service: Endoscopy;  Laterality: N/A;   ESOPHAGOGASTRODUODENOSCOPY (EGD) WITH PROPOFOL N/A 11/15/2019   Procedure: ESOPHAGOGASTRODUODENOSCOPY (EGD) WITH PROPOFOL With savary dilation, need fluroscopy;  Surgeon: Hilarie Fredrickson, MD;  [facility];  Service: Endoscopy;  Laterality: N/A;   ESOPHAGOGASTRODUODENOSCOPY (EGD) WITH PROPOFOL N/A 12/27/2019   Procedure:  ESOPHAGOGASTRODUODENOSCOPY (EGD) WITH PROPOFOL;  Surgeon: Hilarie Fredrickson, MD;  [facility];  Service: Endoscopy;  Laterality: N/A;   ESOPHAGOGASTRODUODENOSCOPY (EGD) WITH PROPOFOL N/A 02/07/2020   Procedure: ESOPHAGOGASTRODUODENOSCOPY (EGD) WITH PROPOFOL;  Surgeon: Hilarie Fredrickson, MD;  [facility];  Service: Endoscopy;  Laterality: N/A;   FOREIGN BODY REMOVAL  10/11/2019   Procedure: FOREIGN BODY REMOVAL;  Surgeon: Iva Boop, MD;  [facility];  Service: Endoscopy;;   SAVORY DILATION N/A 11/15/2019   Procedure: Gaspar Bidding DILATION;  Surgeon: Hilarie Fredrickson, MD;  [facility];  Service: Endoscopy;  Laterality: N/A;   SAVORY DILATION N/A 12/27/2019   Procedure: SAVORY DILATION;  Surgeon: Hilarie Fredrickson, MD;  [facility];  Service: Endoscopy;  Laterality: N/A;  Needs Fluroscopy   SAVORY DILATION N/A 02/07/2020   Procedure: SAVORY DILATION;  Surgeon: Hilarie Fredrickson, MD;  [facility];  Service: Endoscopy;  Laterality: N/A;  Need Fluro    Family History  Problem Relation Age of Onset   Dementia Mother    Heart disease Mother 61       sudden cardiac---?? heart block   Heart disease Father        CABG x2, apparently 3rd open heart surgery.    Heart attack Father    Stroke Father        12 massive stroke led to passing.   Stroke Sister        age 29. healthy prior to this- [address] [place]  Hypertension Sister    Atrial fibrillation Sister    Colon cancer Neg Hx    Stomach cancer Neg Hx    Rectal cancer Neg Hx    Pancreatic cancer Neg Hx    Esophageal cancer Neg Hx     Medications- reviewed and updated Current Outpatient Medications  Medication Sig Dispense Refill   Calcium Carb-Cholecalciferol (CALCIUM 600+D3 PO) Take 1 tablet by mouth daily.     Cyanocobalamin (VITAMIN B-12 PO) Place 1 mL under the tongue daily. Pt Taking weekly     omeprazole (PRILOSEC) 40 MG capsule TAKE 1 CAPSULE (40 MG TOTAL) BY MOUTH DAILY. 90 capsule 3   No  current facility-administered medications for this visit.    Allergies-reviewed and updated Allergies  Allergen Reactions   Adhesive [Tape] Swelling   E-Mycin [Erythromycin Base] Swelling   Silicone Rash    Patient states that she has some silicone glasses that Breaks her face out. No other glasses does that to her.     Social History   Social History Narrative   Married for 26 years. No kids. 4 cats (lost one 06/2014, another in 2016). 1 cat in chemo in 2017 for 3 years      Retired finally December 2020.    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       Socializes way too little reportedly   Loves to exercise, [place] [employer] [place] boating, has place in [place]. Thinking about retirement by age 35.   Eats healthy-kale, spinach beans   Objective  Objective:  BP (!) 160/70   Pulse 75   Temp (!) 97 F (36.1 C)   Ht 5' 1.5" (1.562 m)   Wt 111 lb 12.8 oz (50.7 kg)   SpO2 99%   BMI 20.78 kg/m  Gen: NAD, resting comfortably HEENT: Mucous membranes are moist. Oropharynx normal Neck: no thyromegaly CV: RRR no murmurs rubs or gallops Lungs: CTAB no crackles, wheeze, rhonchi Abdomen: soft/nontender/nondistended/normal bowel sounds. No rebound or guarding.  Ext: no edema Skin: warm, dry Neuro: grossly normal, moves all extremities, PERRLA   Assessment and Plan   67 y.o. female presenting for annual physical.  Health Maintenance counseling: 1. Anticipatory guidance: Patient counseled regarding regular dental exams -q6 months, eye exams -questions 2 years and due in october,  avoiding smoking and second hand smoke , limiting alcohol to 1 beverage per day- last year down to 2 a day- still at 2- advised cutting to 1 a day again , no illicit drugs .   2. Risk factor reduction:  Advised patient of need for regular exercise and  diet rich and fruits and vegetables to reduce risk of heart attack and stroke.  Exercise- daily golfing and walking at least 5 miles but up to 10 miles.  Diet/weight management-healthy weight. Had been as low as 99 when had prior esophagus issues Wt Readings from Last 3 Encounters:  08/26/23 111 lb 12.8 oz (50.7 kg)  05/27/23 111 lb (50.3 kg)  10/17/22 110 lb (49.9 kg)  3. Immunizations/screenings/ancillary studies- flu shot today, Covid shot in fall consider- scheduled in october. Consider RSV- less likely Immunization History  Administered Date(s) Administered   Fluad Quad(high Dose 65+) 08/20/2021, 08/27/2022   Fluad Trivalent(High Dose 65+) 08/26/2023   Influenza Split 10/21/2011, 10/26/2012   Influenza, High Dose Seasonal PF 08/27/2022  Influenza,inj,Quad PF,6+ Mos 09/29/2013, 09/06/2014, 10/24/2015, 09/24/2016, 10/07/2017, 10/01/2018, 09/07/2019, 10/03/2020   Moderna SARS-COV2 Booster Vaccination 09/12/2022   Moderna Sars-Covid-2 Vaccination 03/08/2020, 04/05/2020, 11/28/2020   PFIZER Comirnaty(Gray Top)Covid-19 Tri-Sucrose Vaccine 09/18/2022   PNEUMOCOCCAL CONJUGATE-20 08/20/2021   Tdap 04/20/2012, 08/27/2022   Zoster Recombinant(Shingrix) 12/17/2018, 03/02/2019  4. Cervical cancer screening- normal pap January 2019 with HPV negative- good for 5 years and now technically passed age based screening recommendations will discontinue  5. Breast cancer screening-  breast exam - self exams usually and mammogram August 30 2022 and scheduled next month  6. Colon cancer screening - September 14 2021 with 10 year repeat 7. Skin cancer screening- dermatology q2 yearly with Dr. Emily Filbert- scheduled October 16.   advised regular sunscreen use. Denies worrisome, changing, or new skin lesions.  8. Birth control/STD check- postmenopause and monogamous   9. Osteoporosis screening at 65-worst t score 02/24 February 2019- technically had osteoprososis based on prior one time evaluation but worked on weight  bearing,calcium, diet, vitamin D- stable October 2022- we can repeat in 2 years- ordered today for breast center 10. Smoking associated screening - never smoker  Status of chronic or acute concerns   # GERD with history of esophageal stricture requiring multiple dilations S:Medication: Omeprazole 40 mg daily-not ideal for osteoporosis but needed for history of stricture- had Dr. Marina Goodell check in june A/P: stable- continue current medicines     #hyperlipidemia S: Medication: no medication  -Unable to calculate ASCVD risk with HDL over 100 Lab Results  Component Value Date   CHOL 312 (H) 08/22/2022   HDL 108.60 08/22/2022   LDLCALC 190 (H) 08/22/2022   LDLDIRECT 140.1 04/13/2012   TRIG 67.0 08/22/2022   CHOLHDL 3 08/22/2022  A/P: she is interested in CT calcium but wants to see #s first before deciding - with prediabetes want to be cautious as well  #Thyroid disease-history of Graves' disease treated with PTU in the past.  No current prescription.  Update TSH with labs today    #hypertension-previously listed as whitecoat hypertension but home numbers later elevated S: blood pressure went back do when retired Home readings #s: most recent blood pressure reading 129/73 -average of monthly readings  avg 136.1429 76   BP Readings from Last 3 Encounters:  08/26/23 (!) 160/70  05/27/23 132/74  08/22/22 136/80  A/P: blood pressure well controlled most recently but average in last 6 months slightly above 135/85- she prefers to hold off on medications and use 140/90 goal    # Hyperglycemia/insulin resistance/prediabetes-peak A1c 5.8 2022 S:  Medication: none Lab Results  Component Value Date   HGBA1C 5.7 08/22/2022   HGBA1C 5.8 08/20/2021   HGBA1C 5.5 12/09/2019  A/P: a1c stable last year- update with labs  # B12 deficiency S: Current treatment/medication (oral vs. IM): Prefers oral treatment- twice weekly  Lab Results  Component Value Date   VITAMINB12 591 08/22/2022  A/P:  hopefully stable- update b12 today. Continue current meds for now    Recommended follow up: Return in about 1 year (around 08/25/2024) for physical or sooner if needed.Schedule b4 you leave. Future Appointments  Date Time Provider Department Center  10/08/2023 10:20 AM [facility] 3 [facility]   Lab/Order associations: fasting other than some Gatorade after midnight   ICD-10-CM   1. Routine general medical examination at a health care facility  Z00.00     2. Encounter for immunization  Z23 Flu Vaccine Trivalent High Dose (Fluad)    3. Hyperlipidemia, unspecified  hyperlipidemia type  E78.5     4. Hyperglycemia  R73.9     5. Screening for diabetes mellitus  Z13.1     6. B12 deficiency  E53.8     7. Thyroid disease  E07.9     8. Age-related osteoporosis without current pathological fracture  M81.0      No orders of the defined types were placed in this encounter.  Return precautions advised.  Tana Conch, MD

## 2023-08-27 LAB — VITAMIN B12: Vitamin B-12: 605 pg/mL (ref 211–911)

## 2023-08-27 LAB — TSH: TSH: 3.45 u[IU]/mL (ref 0.35–5.50)

## 2023-08-29 ENCOUNTER — Encounter: Payer: Self-pay | Admitting: Family Medicine

## 2023-08-29 DIAGNOSIS — E785 Hyperlipidemia, unspecified: Secondary | ICD-10-CM

## 2023-10-03 ENCOUNTER — Ambulatory Visit (HOSPITAL_BASED_OUTPATIENT_CLINIC_OR_DEPARTMENT_OTHER)
Admission: RE | Admit: 2023-10-03 | Discharge: 2023-10-03 | Disposition: A | Discharge status: Home / Self Care | Payer: Medicare Other | Source: Ambulatory Visit | Attending: Family Medicine | Admitting: Family Medicine

## 2023-10-03 DIAGNOSIS — E785 Hyperlipidemia, unspecified: Secondary | ICD-10-CM | POA: Insufficient documentation

## 2023-10-07 ENCOUNTER — Encounter: Payer: Self-pay | Admitting: Family Medicine

## 2023-10-08 ENCOUNTER — Ambulatory Visit
Admission: RE | Admit: 2023-10-08 | Discharge: 2023-10-08 | Disposition: A | Discharge status: Home / Self Care | Payer: Medicare Other | Source: Ambulatory Visit | Attending: Family Medicine | Admitting: Family Medicine

## 2023-10-08 DIAGNOSIS — Z1231 Encounter for screening mammogram for malignant neoplasm of breast: Secondary | ICD-10-CM

## 2023-10-11 ENCOUNTER — Other Ambulatory Visit: Payer: Self-pay | Admitting: Family

## 2023-10-11 MED ORDER — ROSUVASTATIN CALCIUM 5 MG PO TABS: 5.0000 mg | ORAL_TABLET | Freq: Every day | ORAL | 3 refills | Status: DC

## 2023-10-13 ENCOUNTER — Other Ambulatory Visit: Payer: Self-pay

## 2023-10-13 DIAGNOSIS — E785 Hyperlipidemia, unspecified: Secondary | ICD-10-CM

## 2023-10-20 NOTE — Telephone Encounter (Signed)
See below

## 2023-10-27 ENCOUNTER — Encounter: Payer: Self-pay | Admitting: Family Medicine

## 2023-10-28 ENCOUNTER — Encounter: Payer: Self-pay | Admitting: Pharmacist

## 2023-10-28 NOTE — Progress Notes (Signed)
   10/28/2023 Name: Crystal [name] MRN: [id number] DOB: 1956/11/13  Chief Complaint  Patient presents with   Medication Management    Subjective: Clinical pharmacist was asked for alternative to rosuvastatin since PCP was advised by Ms. Albo's pharmacy that rosuvastatin should not be crushed. Patient has difficulty swallowing tablets.  Would prefer to be able to crush tablets and mix with soft food like applesauce or use liquid.   Current Pharmacy:  [id number] - [place], [place] - [address] [facility] [address] [place] [place] [address] Phone: [phone] Fax: [phone]  [id number] [place], [place] - [address] [place] [address] Phone: [phone] Fax: [phone]   Objective:  Lab Results  Component Value Date   HGBA1C 5.9 08/26/2023    Lab Results  Component Value Date   CREATININE 0.69 08/26/2023   BUN 14 08/26/2023   NA 139 08/26/2023   K 4.1 08/26/2023   CL 100 08/26/2023   CO2 28 08/26/2023    Lab Results  Component Value Date   CHOL 300 (H) 08/26/2023   HDL 95.90 08/26/2023   LDLCALC 189 (H) 08/26/2023   LDLDIRECT 140.1 04/13/2012   TRIG 76.0 08/26/2023   CHOLHDL 3 08/26/2023    Medications Reviewed Today   Medications were not reviewed in this encounter       Assessment/Plan:  Medication Administration Difficulty / swallowing difficulty Reviewed literature regarding statins and dosage forms available.  Forwarded the options below to PCP to review. I suggested option 1 as likely the most affordable option.  1) change to pravastatin 40mg  - can be crushed and mixed with soft food like applesauce. (It can also be made into a suspension with 1:1 Ora Sweet and Ora Plus by compounding pharmacy)   2) change to simvastatin liquid (brand if FloLipid) 4mg /mL - would recommend 20mg  per dose or 5ml. Would need for 30 days. I was not able to get a cost estimate on  simvastatin suspension. Would need prior authorization and might be hard to find.   3) change to atorvastatin suspension 20mg  / 5mL - 2.71mL = 10mg  daily. Come in bottles so would last 2 months but cost is $199 per bottle. Would need prior authorization and might be hard to find.   4) Change to rosuvastatin capsules - Ellazor 5mg  - can open capsules and sprinkle on applesauce. The last time I tried to find this the [facility] found that it was on backorder with no release date. Cost is $113 for 30 capsules or $315 for 90 capsules.    Henrene Pastor, PharmD Clinical Pharmacist [facility]  Population Health [phone]

## 2023-10-29 MED ORDER — PRAVASTATIN SODIUM 40 MG PO TABS: 40.0000 mg | ORAL_TABLET | Freq: Every day | ORAL | 3 refills | Status: DC

## 2024-01-25 ENCOUNTER — Other Ambulatory Visit: Payer: Self-pay | Admitting: Family

## 2024-04-12 ENCOUNTER — Ambulatory Visit: Payer: Medicare Other | Admitting: Behavioral Health

## 2024-05-25 ENCOUNTER — Ambulatory Visit: Payer: Self-pay | Admitting: Family Medicine

## 2024-05-25 ENCOUNTER — Ambulatory Visit
Admission: RE | Admit: 2024-05-25 | Discharge: 2024-05-25 | Disposition: A | Discharge status: Home / Self Care | Payer: Medicare Other | Source: Ambulatory Visit | Attending: Family Medicine | Admitting: Family Medicine

## 2024-05-25 DIAGNOSIS — M810 Age-related osteoporosis without current pathological fracture: Secondary | ICD-10-CM

## 2024-05-26 ENCOUNTER — Ambulatory Visit: Admitting: Behavioral Health

## 2024-05-26 NOTE — Progress Notes (Unsigned)
   Crystal Lever, LMFT

## 2024-06-07 ENCOUNTER — Ambulatory Visit: Payer: Medicare Other | Attending: Cardiovascular Disease | Admitting: Cardiovascular Disease

## 2024-06-07 ENCOUNTER — Encounter: Payer: Self-pay | Admitting: Cardiovascular Disease

## 2024-06-07 VITALS — BP 188/94 | HR 71 | Ht 61.0 in | Wt 111.0 lb

## 2024-06-07 DIAGNOSIS — R931 Abnormal findings on diagnostic imaging of heart and coronary circulation: Secondary | ICD-10-CM | POA: Insufficient documentation

## 2024-06-07 DIAGNOSIS — E782 Mixed hyperlipidemia: Secondary | ICD-10-CM | POA: Diagnosis not present

## 2024-06-07 DIAGNOSIS — I10 Essential (primary) hypertension: Secondary | ICD-10-CM | POA: Diagnosis not present

## 2024-06-07 DIAGNOSIS — R0609 Other forms of dyspnea: Secondary | ICD-10-CM | POA: Insufficient documentation

## 2024-06-07 DIAGNOSIS — R030 Elevated blood-pressure reading, without diagnosis of hypertension: Secondary | ICD-10-CM | POA: Diagnosis not present

## 2024-06-07 NOTE — Assessment & Plan Note (Addendum)
 Coronary calcium  score performed 10/03/2023 was 199 all in the LAD territory.  She is active and completely asymptomatic.  Based on this, we will address her cardiac risk factors and optimize her lipid-lowering medications.

## 2024-06-07 NOTE — Assessment & Plan Note (Signed)
 History of essential hypertension blood pressure measured today 137/74.  He is on losartan.Crystal [name]

## 2024-06-07 NOTE — Progress Notes (Signed)
 06/07/2024 Crystal [name]   03/21/1956  [id number]  Primary Physician Arlene Ben Saverio Curling, MD Primary Cardiologist: Avanell Leigh MD Bennye Bravo, [place]  HPI:  Crystal [name] is a 68 y.o. thin and fit appearing married Caucasian female with no children referred by her PCP, Dr. Clarisa Crooked, because of elevated cholesterol and a positive coronary calcium  score.  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  Risk factors include hyperlipidemia suboptimally treated.  She does have a family history of heart disease in the father who had a myocardial infarction at age 52 bypass surgery after that.  He did lipids and was 89.  She is very active and walks 5 to 7 miles a day without symptoms.  She is never had a heart attack or stroke.  She denies chest pain or shortness of breath.  She does have a history of esophageal stricture.   Current Meds  Medication Sig   Calcium  Carb-Cholecalciferol (CALCIUM  600+D3 PO) Take 1 tablet by mouth daily.   Cyanocobalamin  (VITAMIN B-12 PO) Place 1 mL under the tongue daily. Pt Taking weekly   omeprazole  (PRILOSEC) 40 MG capsule TAKE 1 CAPSULE (40 MG TOTAL) BY MOUTH DAILY.   pravastatin  (PRAVACHOL ) 40 MG tablet Take 1 tablet (40 mg total) by mouth daily.     Allergies  Allergen Reactions   Adhesive [Tape] Swelling   E-Mycin [Erythromycin Base] Swelling   Silicone Rash    Patient states that she has some silicone glasses that Breaks her face out. No other glasses does that to her.     Social History   Socioeconomic History   Marital status: Married    Spouse name: Not on file   Number of children: Not on file   Years of education: Not on file   Highest education level: Not on file  Occupational History   Not on file  Tobacco Use   Smoking status: Never   Smokeless tobacco: Never  Vaping Use   Vaping status: Never Used  Substance and Sexual Activity   Alcohol use: Yes    Alcohol/week: 14.0 standard drinks of alcohol     Types: 14 Glasses of wine per week   Drug use: No   Sexual activity: Not on file  Other Topics Concern   Not on file  Social History Narrative   Married for 26 years. No kids. 4 cats (lost one 06/2014, another in 2016). 1 cat in chemo in 2017 for 3 years      Retired finally December 2020.    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       Socializes way too little reportedly   Loves to exercise, [place] [employer] and [place] boating, has place in [place]. Thinking about retirement by age 85.   Eats healthy-kale, spinach beans   Social Drivers of Corporate investment banker Strain: Low Risk  (10/17/2022)   Overall Financial Resource Strain (CARDIA)    Difficulty of Paying Living Expenses: Not hard at all  Food Insecurity: No Food Insecurity (10/17/2022)   Hunger Vital Sign    Worried About Running Out of Food in the Last Year: Never true    Ran Out of Food in the Last Year: Never true  Transportation Needs: No Transportation Needs (10/17/2022)   PRAPARE - Transportation  Lack of Transportation (Medical): No    Lack of Transportation (Non-Medical): No  Physical Activity: Sufficiently Active (10/17/2022)   Exercise Vital Sign    Days of Exercise per Week: 5 days    Minutes of Exercise per Session: 120 min  Stress: No Stress Concern Present (10/17/2022)   [employer] of Occupational Health - Occupational Stress Questionnaire    Feeling of Stress : Not at all  Social Connections: Moderately Integrated (10/17/2022)   Social Connection and Isolation Panel    Frequency of Communication with Friends and Family: Once a week    Frequency of Social Gatherings with Friends and Family: More than three times a week    Attends Religious Services: Never    Database administrator or Organizations: Yes    Attends Banker  Meetings: 1 to 4 times per year    Marital Status: Married  Catering manager Violence: Not At Risk (10/17/2022)   Humiliation, Afraid, Rape, and Kick questionnaire    Fear of Current or Ex-Partner: No    Emotionally Abused: No    Physically Abused: No    Sexually Abused: No     Review of Systems: General: negative for chills, fever, night sweats or weight changes.  Cardiovascular: negative for chest pain, dyspnea on exertion, edema, orthopnea, palpitations, paroxysmal nocturnal dyspnea or shortness of breath Dermatological: negative for rash Respiratory: negative for cough or wheezing Urologic: negative for hematuria Abdominal: negative for nausea, vomiting, diarrhea, bright red blood per rectum, melena, or hematemesis Neurologic: negative for visual changes, syncope, or dizziness All other systems reviewed and are otherwise negative except as noted above.    Blood pressure (!) 188/94, pulse 71, height 5' 1 (1.549 m), weight 111 lb (50.3 kg), SpO2 100%.  General appearance: alert and no distress Neck: no adenopathy, no carotid bruit, no JVD, supple, symmetrical, trachea midline, and thyroid  not enlarged, symmetric, no tenderness/mass/nodules Lungs: clear to auscultation bilaterally Heart: regular rate and rhythm, S1, S2 normal, no murmur, click, rub or gallop Extremities: extremities normal, atraumatic, no cyanosis or edema Pulses: 2+ and symmetric Skin: Skin color, texture, turgor normal. No rashes or lesions Neurologic: Grossly normal  EKG EKG Interpretation Date/Time:  Monday June 07 2024 11:18:34 EDT Ventricular Rate:  71 PR Interval:  126 QRS Duration:  82 QT Interval:  380 QTC Calculation: 412 R Axis:   -1  Text Interpretation: Normal sinus rhythm Possible Left atrial enlargement Minimal voltage criteria for LVH, may be normal variant ( R in aVL ) Nonspecific ST abnormality Confirmed by Lauro Portal [phone]) on 06/07/2024 11:47:37 AM    ASSESSMENT AND PLAN:    Essential hypertension History of essential hypertension blood pressure measured today 137/74.  He is on losartan.Aaron Aas  Hyperlipidemia History of hyperlipidemia on pravastatin .  This was began after her lipid profile performed 08/26/2023 revealed total cholesterol 300 with an LDL of 189 and HDL of 95.  Apparently she has esophageal stricture and this is the only statin that can be crushed.  Given the extent of her hyperlipidemia and her elevated coronary calcium  score I think she would benefit from being on a PCSK9.  LDL goal less than 70  Elevated coronary artery calcium  score Coronary calcium  score performed 10/03/2023 was 199 all in the LAD territory.  She is active and completely asymptomatic.  Based on this, we will address her cardiac risk factors and optimize her lipid-lowering medications.  Dyspnea on exertion History of dyspnea on exertion which began after COVID back in 2020.  His coronary calcium  score is low and he has normal LV systolic function.     Avanell Leigh MD FACP,FACC,FAHA, [facility] 06/07/2024 12:01 PM

## 2024-06-07 NOTE — Patient Instructions (Signed)

## 2024-06-07 NOTE — Assessment & Plan Note (Signed)
 History of dyspnea on exertion which began after COVID back in 2020.  His coronary calcium  score is low and he has normal LV systolic function.

## 2024-06-07 NOTE — Assessment & Plan Note (Addendum)
 History of hyperlipidemia on pravastatin .  This was began after her lipid profile performed 08/26/2023 revealed total cholesterol 300 with an LDL of 189 and HDL of 95.  Apparently she has esophageal stricture and this is the only statin that can be crushed.  Given the extent of her hyperlipidemia and her elevated coronary calcium  score I think she would benefit from being on a PCSK9.  LDL goal less than 70

## 2024-06-23 ENCOUNTER — Ambulatory Visit: Admitting: Behavioral Health

## 2024-06-28 ENCOUNTER — Encounter: Payer: Self-pay | Admitting: Cardiovascular Disease

## 2024-06-28 DIAGNOSIS — E782 Mixed hyperlipidemia: Secondary | ICD-10-CM

## 2024-08-12 ENCOUNTER — Ambulatory Visit: Admitting: Pharmacist

## 2024-08-17 ENCOUNTER — Ambulatory Visit: Attending: Cardiology | Admitting: Pharmacist

## 2024-08-17 ENCOUNTER — Telehealth: Payer: Self-pay | Admitting: Family Medicine

## 2024-08-17 DIAGNOSIS — E782 Mixed hyperlipidemia: Secondary | ICD-10-CM

## 2024-08-17 DIAGNOSIS — R931 Abnormal findings on diagnostic imaging of heart and coronary circulation: Secondary | ICD-10-CM

## 2024-08-17 NOTE — Progress Notes (Signed)
 Patient ID: TRIXY LOYOLA                 DOB: September 23, 1956                    MRN: [id number]     HPI: Crystal [name] is a 67 y.o. female patient referred to lipid clinic by Dr Court. PMH is significant for HLD and elevated coronary calcium  score. Patient is intolerant of high intensity statins.  Patient presents today to discuss lipid management. Currently managed on pravastatin  40mg  once daily without adverse effects. Previously has tried rosuvastatin  but was intolerant.  Has significant family history of early CAD on her father's side. Father had his first MI in his 56s.  Labs have not been updated recently.  Current Medications: Pravastatin  40mg  Intolerances: Rosuvastatin  Risk Factors: Family history - dad MI LDL goal: <70   Imaging: FINDINGS: Coronary arteries: Normal origins.   Coronary Calcium  Score:   Left main: 0   Left anterior descending artery: 199   Left circumflex artery: 0   Right coronary artery: 0   Total: 199   Percentile: 86   Pericardium: Normal.   Ascending Aorta: Normal caliber.   Non-cardiac: See separate report from [facility].   IMPRESSION: Coronary calcium  score of 199. This was 3 percentile for age-, race-, and sex-matched controls.  Past Medical History:  Diagnosis Date   Complication of anesthesia    kept a severe HA for 10 days after     Esophageal stenosis    Graves' disease 1985   treated with PTU   Hyperlipidemia    Insomnia    Osteoporosis    Wears glasses    White coat hypertension     Current Outpatient Medications on File Prior to Visit  Medication Sig Dispense Refill   Calcium  Carb-Cholecalciferol (CALCIUM  600+D3 PO) Take 1 tablet by mouth daily.     Cyanocobalamin  (VITAMIN B-12 PO) Place 1 mL under the tongue daily. Pt Taking weekly     omeprazole  (PRILOSEC) 40 MG capsule TAKE 1 CAPSULE (40 MG TOTAL) BY MOUTH DAILY. 90 capsule 3   pravastatin  (PRAVACHOL ) 40 MG tablet Take 1 tablet (40 mg total) by  mouth daily. 90 tablet 3   No current facility-administered medications on file prior to visit.    Allergies  Allergen Reactions   Adhesive [Tape] Swelling   E-Mycin [Erythromycin Base] Swelling   Silicone Rash    Patient states that she has some silicone glasses that Breaks her face out. No other glasses does that to her.     Assessment/Plan:  1. Hyperlipidemia - Patient needs updated lipid panel. If LDL above goal, will start Repatha/Praluent. Using demo pen, educated patient about mechanism of action, storage, site selection, administration, and possible adverse effects. Will submit PA.  Continue pravastatin  40mg  daily Start Repatha/Praluent q 2 weeks  Crystal [name], PharmD, BCACP, CDCES, CPP [facility] [address], [place], [place] [address] Phone: [phone]; Fax: [phone] 12/08/2024 9:29 AM

## 2024-08-17 NOTE — Patient Instructions (Addendum)
 It was nice meeting you today  We would like your LDL (bad cholesterol) to be less than 70  Please continue your pravastatin  40mg  once daily  The medications we discussed today are called Praluent and Repatha. Both are taken once every 2 weeks  I will complete the prior authorization for you and contact you with the results after you update the fasting lipid panel which I ordered for you  Once you start the medication we will recheck your fasting lipid panel again in 3 months  Please let us  know if you have any questions  Medford Bolk, PharmD, BCACP, CDCES, CPP [facility] [address] [place], [place] [address] Phone: [phone]; Fax: [phone] 08/17/2024 8:50 AM

## 2024-08-17 NOTE — Telephone Encounter (Signed)
 Spoke with patients husband and she needs updated lipid panel- would like to do here- ordered today and she can come by tomorrow once he schedules lab visit

## 2024-08-18 ENCOUNTER — Ambulatory Visit: Payer: Self-pay | Admitting: Family Medicine

## 2024-08-18 ENCOUNTER — Other Ambulatory Visit (INDEPENDENT_AMBULATORY_CARE_PROVIDER_SITE_OTHER)

## 2024-08-18 DIAGNOSIS — E782 Mixed hyperlipidemia: Secondary | ICD-10-CM | POA: Diagnosis not present

## 2024-08-18 LAB — LIPID PANEL
Cholesterol: 272 mg/dL — ABNORMAL HIGH (ref 0–200)
HDL: 115.4 mg/dL (ref >39.00)
LDL Cholesterol: 143 mg/dL — ABNORMAL HIGH (ref 0–99)
NonHDL: 156.38
Total CHOL/HDL Ratio: 2
Triglycerides: 66 mg/dL (ref 0.0–149.0)
VLDL: 13.2 mg/dL (ref 0.0–40.0)

## 2024-08-19 NOTE — Progress Notes (Signed)
Results seen in my chart 

## 2024-08-24 ENCOUNTER — Other Ambulatory Visit: Payer: Self-pay | Admitting: Family Medicine

## 2024-08-24 DIAGNOSIS — Z1231 Encounter for screening mammogram for malignant neoplasm of breast: Secondary | ICD-10-CM

## 2024-09-09 ENCOUNTER — Other Ambulatory Visit: Payer: Self-pay | Admitting: Internal Medicine

## 2024-10-11 ENCOUNTER — Encounter: Payer: Self-pay | Admitting: Family Medicine

## 2024-10-11 ENCOUNTER — Ambulatory Visit
Admission: RE | Admit: 2024-10-11 | Discharge: 2024-10-11 | Disposition: A | Discharge status: Home / Self Care | Source: Ambulatory Visit | Attending: Family Medicine | Admitting: Family Medicine

## 2024-10-11 ENCOUNTER — Ambulatory Visit: Payer: Self-pay | Admitting: Family Medicine

## 2024-10-11 ENCOUNTER — Ambulatory Visit (INDEPENDENT_AMBULATORY_CARE_PROVIDER_SITE_OTHER): Payer: Medicare Other | Admitting: Family Medicine

## 2024-10-11 VITALS — BP 138/79 | HR 75 | Temp 97.6°F | Ht 61.0 in | Wt 117.0 lb

## 2024-10-11 DIAGNOSIS — E079 Disorder of thyroid, unspecified: Secondary | ICD-10-CM | POA: Diagnosis not present

## 2024-10-11 DIAGNOSIS — M810 Age-related osteoporosis without current pathological fracture: Secondary | ICD-10-CM | POA: Diagnosis not present

## 2024-10-11 DIAGNOSIS — Z131 Encounter for screening for diabetes mellitus: Secondary | ICD-10-CM

## 2024-10-11 DIAGNOSIS — R739 Hyperglycemia, unspecified: Secondary | ICD-10-CM | POA: Diagnosis not present

## 2024-10-11 DIAGNOSIS — Z1231 Encounter for screening mammogram for malignant neoplasm of breast: Secondary | ICD-10-CM

## 2024-10-11 DIAGNOSIS — Z23 Encounter for immunization: Secondary | ICD-10-CM

## 2024-10-11 DIAGNOSIS — E782 Mixed hyperlipidemia: Secondary | ICD-10-CM | POA: Diagnosis not present

## 2024-10-11 DIAGNOSIS — E538 Deficiency of other specified B group vitamins: Secondary | ICD-10-CM | POA: Diagnosis not present

## 2024-10-11 DIAGNOSIS — Z0000 Encounter for general adult medical examination without abnormal findings: Secondary | ICD-10-CM

## 2024-10-11 LAB — TSH: TSH: 3.32 u[IU]/mL (ref 0.35–5.50)

## 2024-10-11 LAB — COMPREHENSIVE METABOLIC PANEL WITH GFR
ALT: 18 U/L (ref 0–35)
AST: 30 U/L (ref 0–37)
Albumin: 4.8 g/dL (ref 3.5–5.2)
Alkaline Phosphatase: 56 U/L (ref 39–117)
BUN: 16 mg/dL (ref 6–23)
CO2: 26 meq/L (ref 19–32)
Calcium: 9.3 mg/dL (ref 8.4–10.5)
Chloride: 98 meq/L (ref 96–112)
Creatinine, Ser: 0.68 mg/dL (ref 0.40–1.20)
GFR: 89.51 mL/min (ref >60.00)
Glucose, Bld: 107 mg/dL — ABNORMAL HIGH (ref 70–99)
Potassium: 4.3 meq/L (ref 3.5–5.1)
Sodium: 136 meq/L (ref 135–145)
Total Bilirubin: 0.4 mg/dL (ref 0.2–1.2)
Total Protein: 7.8 g/dL (ref 6.0–8.3)

## 2024-10-11 LAB — CBC WITH DIFFERENTIAL/PLATELET
Basophils Absolute: 0.1 K/uL (ref 0.0–0.1)
Basophils Relative: 1 % (ref 0.0–3.0)
Eosinophils Absolute: 0.1 K/uL (ref 0.0–0.7)
Eosinophils Relative: 2.3 % (ref 0.0–5.0)
HCT: 38.5 % (ref 36.0–46.0)
Hemoglobin: 12.9 g/dL (ref 12.0–15.0)
Lymphocytes Relative: 26.1 % (ref 12.0–46.0)
Lymphs Abs: 1.5 K/uL (ref 0.7–4.0)
MCHC: 33.6 g/dL (ref 30.0–36.0)
MCV: 98.3 fl (ref 78.0–100.0)
Monocytes Absolute: 0.7 K/uL (ref 0.1–1.0)
Monocytes Relative: 11.7 % (ref 3.0–12.0)
Neutro Abs: 3.3 K/uL (ref 1.4–7.7)
Neutrophils Relative %: 58.9 % (ref 43.0–77.0)
Platelets: 237 K/uL (ref 150.0–400.0)
RBC: 3.92 Mil/uL (ref 3.87–5.11)
RDW: 13.4 % (ref 11.5–15.5)
WBC: 5.6 K/uL (ref 4.0–10.5)

## 2024-10-11 LAB — VITAMIN B12: Vitamin B-12: 507 pg/mL (ref 211–911)

## 2024-10-11 LAB — VITAMIN D 25 HYDROXY (VIT D DEFICIENCY, FRACTURES): VITD: 58.3 ng/mL (ref 30.00–100.00)

## 2024-10-11 LAB — HEMOGLOBIN A1C: Hgb A1c MFr Bld: 5.9 % (ref 4.6–6.5)

## 2024-10-11 MED ORDER — PRAVASTATIN SODIUM 80 MG PO TABS: 40.0000 mg | ORAL_TABLET | Freq: Every day | ORAL | 3 refills | Status: DC

## 2024-10-11 NOTE — Patient Instructions (Addendum)
 Please stop by lab before you go If you have mychart- we will send your results within 3 business days of us  receiving them.  If you do not have mychart- we will call you about results within 5 business days of us  receiving them.  *please also note that you will see labs on mychart as soon as they post. I will later go in and write notes on them- will say notes from Dr. Katrinka   6 labs only- no cholesterol/lipid   lipids have improved but not at goal. Has considered pcsk9 but has not heard back from cardiology yet- they did discuss possible 80 mg pravastatin  so I am sending that in today as starting point and she will either get labs in 2 months in [place]  or do them next June when she returns   Recommended follow up: Return in about 1 year (around 10/11/2025) for physical or sooner if needed.Schedule b4 you leave.

## 2024-10-11 NOTE — Progress Notes (Signed)
 Phone [phone]   Subjective:  Patient presents today for their annual physical. Chief complaint-noted.   See problem oriented charting- ROS- full  review of systems was completed and negative except for topics noted under acute/chronic concerns   The following were reviewed and entered/updated in epic: Past Medical History:  Diagnosis Date   Complication of anesthesia    kept a severe HA for 10 days after     Esophageal stenosis    Graves' disease 1985   treated with PTU   Hyperlipidemia    Insomnia    Osteoporosis    Wears glasses    White coat hypertension    Patient Active Problem List   Diagnosis Date Noted   Osteoporosis 07/26/2014    Priority: High   Hyperglycemia 08/22/2022    Priority: Medium    B12 deficiency 08/22/2022    Priority: Medium    Esophageal stricture     Priority: Medium    Hyperlipidemia 01/15/2018    Priority: Medium    Elevated blood pressure reading in office with white coat syndrome, without diagnosis of hypertension 07/26/2014    Priority: Medium    Thyroid  disease     Priority: Medium    Elevated coronary artery calcium  score 06/07/2024   Dyspnea on exertion 06/07/2024   Past Surgical History:  Procedure Laterality Date   BIOPSY  10/11/2019   Procedure: BIOPSY;  Surgeon: Avram Lupita BRAVO, MD;  Location: [facility];  Service: Endoscopy;;   COLONOSCOPY     ESOPHAGEAL DILATION  10/11/2019   Procedure: ESOPHAGEAL DILATION;  Surgeon: Avram Lupita BRAVO, MD;  Location: [facility];  Service: Endoscopy;;   ESOPHAGOGASTRODUODENOSCOPY N/A 10/11/2019   Procedure: ESOPHAGOGASTRODUODENOSCOPY (EGD);  Surgeon: Avram Lupita BRAVO, MD;  Location: [facility];  Service: Endoscopy;  Laterality: N/A;   ESOPHAGOGASTRODUODENOSCOPY (EGD) WITH PROPOFOL  N/A 11/15/2019   Procedure: ESOPHAGOGASTRODUODENOSCOPY (EGD) WITH PROPOFOL  With savary dilation, need fluroscopy;  Surgeon: Abran Norleen SAILOR, MD;  Location: [facility];  Service: Endoscopy;  Laterality: N/A;    ESOPHAGOGASTRODUODENOSCOPY (EGD) WITH PROPOFOL  N/A 12/27/2019   Procedure: ESOPHAGOGASTRODUODENOSCOPY (EGD) WITH PROPOFOL ;  Surgeon: Abran Norleen SAILOR, MD;  Location: [facility];  Service: Endoscopy;  Laterality: N/A;   ESOPHAGOGASTRODUODENOSCOPY (EGD) WITH PROPOFOL  N/A 02/07/2020   Procedure: ESOPHAGOGASTRODUODENOSCOPY (EGD) WITH PROPOFOL ;  Surgeon: Abran Norleen SAILOR, MD;  Location: [facility];  Service: Endoscopy;  Laterality: N/A;   FOREIGN BODY REMOVAL  10/11/2019   Procedure: FOREIGN BODY REMOVAL;  Surgeon: Avram Lupita BRAVO, MD;  Location: [facility];  Service: Endoscopy;;   SAVORY DILATION N/A 11/15/2019   Procedure: HARLEY DILATION;  Surgeon: Abran Norleen SAILOR, MD;  Location: [facility];  Service: Endoscopy;  Laterality: N/A;   SAVORY DILATION N/A 12/27/2019   Procedure: SAVORY DILATION;  Surgeon: Abran Norleen SAILOR, MD;  Location: [facility];  Service: Endoscopy;  Laterality: N/A;  Needs Fluroscopy   SAVORY DILATION N/A 02/07/2020   Procedure: SAVORY DILATION;  Surgeon: Abran Norleen SAILOR, MD;  Location: [facility];  Service: Endoscopy;  Laterality: N/A;  Need Fluro    Family History  Problem Relation Age of Onset   Dementia Mother    Heart disease Mother 12       sudden cardiac---?? heart block   Heart disease Father        CABG x2, apparently 3rd open heart surgery.    Heart attack Father    Stroke Father        62 massive stroke led to passing.   Stroke Sister  age 77. healthy prior to this- [place]   Hypertension Sister    Atrial fibrillation Sister    Colon cancer Neg Hx    Stomach cancer Neg Hx    Rectal cancer Neg Hx    Pancreatic cancer Neg Hx    Esophageal cancer Neg Hx     Medications- reviewed and updated Current Outpatient Medications  Medication Sig Dispense Refill   Calcium  Carb-Cholecalciferol (CALCIUM  600+D3 PO) Take 1 tablet by mouth daily.     Cyanocobalamin  (VITAMIN B-12 PO) Place 1 mL under the tongue daily. Pt Taking weekly     omeprazole  (PRILOSEC)  40 MG capsule Take 1 capsule (40 mg total) by mouth daily. Office visit for further refills 90 capsule 0   pravastatin  (PRAVACHOL ) 80 MG tablet Take 0.5 tablets (40 mg total) by mouth daily. 90 tablet 3   No current facility-administered medications for this visit.    Allergies-reviewed and updated Allergies  Allergen Reactions   Adhesive [Tape] Swelling   E-Mycin [Erythromycin Base] Swelling   Latex    Silicone Rash    Patient states that she has some silicone glasses that Breaks her face out. No other glasses does that to her.     Social History   Social History Narrative   Married for 26 years. No kids. 4 cats (lost one 06/2014, another in 2016). 1 cat in chemo in 2017 for 3 years      Retired finally December 2020.    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       Socializes way too little reportedly   Loves to exercise, [place] [employer] [place] boating, has place in [place]. Thinking about retirement by age 38.   Eats healthy-kale, spinach beans   Objective  Objective:  BP 138/79 Comment: most recent home bp  Pulse 75   Temp 97.6 F (36.4 C) (Temporal)   Ht 5' 1 (1.549 m)   Wt 117 lb (53.1 kg)   SpO2 99%   BMI 22.11 kg/m  Gen: NAD, resting comfortably HEENT: Mucous membranes are moist. Oropharynx normal Neck: no thyromegaly CV: RRR no murmurs rubs or gallops Lungs: CTAB no crackles, wheeze, rhonchi Abdomen: soft/nontender/nondistended/normal bowel sounds. No rebound or guarding.  Ext: no edema Skin: warm, dry Neuro: grossly normal, moves all extremities, PERRLA   Assessment and Plan   68 y.o. female presenting for annual physical.  Health Maintenance counseling: 1. Anticipatory guidance: Patient counseled regarding regular dental exams - in process of changing- q6 months, eye exams - appointment monday,   avoiding smoking and second hand smoke , limiting alcohol to 1 beverage per day-last year reported about 2 alcoholic beverages per day-we have encouraged 1-currently still 2 a day , no illicit drugs .   2. Risk factor reduction:  Advised patient of need for regular exercise and diet rich and fruits and vegetables to reduce risk of heart attack and stroke.  Exercise-golfing most days.  Walking at least 5 miles most days as well.  Diet/weight management-lowest her weight has been is 99 pounds with esophageal issues in the past but pleased with mild weight gain up to 117. Enjoyed traveling and weight up some with that  Wt Readings from Last 3 Encounters:  10/11/24 117 lb (53.1 kg)  06/07/24 111 lb (50.3 kg)  08/26/23 111 lb 12.8 oz (  50.7 kg)  3. Immunizations/screenings/ancillary studies-flu shot today-consider COVID shot with pharmacy  Immunization History  Administered Date(s) Administered   Fluad Quad(high Dose 65+) 08/20/2021, 08/27/2022   Fluad Trivalent(High Dose 65+) 08/26/2023   INFLUENZA, HIGH DOSE SEASONAL PF 08/27/2022, 10/11/2024   Influenza Split 10/21/2011, 10/26/2012   Influenza,inj,Quad PF,6+ Mos 09/29/2013, 09/06/2014, 10/24/2015, 09/24/2016, 10/07/2017, 10/01/2018, 09/07/2019, 10/03/2020   Moderna SARS-COV2 Booster Vaccination 09/12/2022   Moderna Sars-Covid-2 Vaccination 03/08/2020, 04/05/2020, 11/28/2020   PFIZER Comirnaty(Gray Top)Covid-19 Tri-Sucrose Vaccine 09/18/2022   PNEUMOCOCCAL CONJUGATE-20 08/20/2021   Tdap 04/20/2012, 08/27/2022   Zoster Recombinant(Shingrix ) 12/17/2018, 03/02/2019  4. Cervical cancer screening- normal Pap smear January 2018 with HPV negative good for 5 years now technically passed age-based screening recommendations 5. Breast cancer screening-  breast exam-prefers self exams-and mammogram - scheduled this afternoon breast center 6. Colon cancer screening -September 14, 2021 with 10-year repeat 7. Skin cancer screening-seen by Dr. Robinson a month  ago. advised regular sunscreen use. Denies worrisome, changing, or new skin lesions. Did have some warts.  8. Birth control/STD check- postmenopausal monogamous 9. Osteoporosis screening at 65-see below 10. Smoking associated screening - never smoker  Status of chronic or acute concerns   # Osteoporosis S: Last DEXA: May 25 2024 with worst T-score at -2.4 left femoral neck but no change from  Prior.  Osteoporosis originally noted 07/20/2014 bone density with worst T-score being at -2.6 is actually seen some mild improvement - she has done an amazing job walking to help this  Medication (bisphosphonate or prolia): none  Calcium : 1200mg  (through diet ok) recommended - taking Vitamin D : 1000 units a day recommended- taking A/P: well controlled continue current medications - recheck at least every 2-3 years for stability   # GERD with history of esophageal stricture requiring multiple dilations S:Medication: Omeprazole  40 mg daily-not ideal for osteoporosis but needed for history of stricture A/P: sees Dr. Abran tomorrow with history dilations- doing well lately    #hyperlipidemia with CT calcium  scoring 10/03/23 of 199 at 86%-all in LAD territory S: Medication: Pravastatin  40 mg -only statin she can crush.  PCSK9 has been recommended by cardiology Lab Results  Component Value Date   CHOL 272 (H) 08/18/2024   HDL 115.40 08/18/2024   LDLCALC 143 (H) 08/18/2024   LDLDIRECT 140.1 04/13/2012   TRIG 66.0 08/18/2024   CHOLHDL 2 08/18/2024  A/P: lipids have improved but not at goal. Has considered pcsk9 but has not heard back from cardiology yet- they did discuss possible 80 mg pravastatin  so I am sending that in today as starting point and she will either get labs in 2 months in [place]  or do them next June when she returns CT calcium   elevated- push for LDL under 70   #Thyroid  disease-history of Graves' disease treated with PTU in the past.  No current prescription. Update TSH with labs    #hypertension-previously listed as whitecoat hypertension but home numbers later elevated S: no prescription  Home readings #s: mostly 130s- sparing 140's but average under 140 A/P: reasonable control- continue current medications    # Hyperglycemia/insulin resistance/prediabetes-peak A1c 5.8 2022 S:  Medication: none Lab Results  Component Value Date   HGBA1C 5.9 08/26/2023   HGBA1C 5.7 08/22/2022   HGBA1C 5.8 08/20/2021   A/P: hopefully stable- update a1c today. Continue without meds for now   # B12 deficiency S: Current treatment/medication (oral vs. IM): Prefers oral treatment-twice weekly Lab Results  Component Value Date   VITAMINB12 605 08/26/2023  A/P: hopefully stable-  update b12 today. Continue current meds for now    Recommended follow up: Return in about 1 year (around 10/11/2025) for physical or sooner if needed.Schedule b4 you leave. Future Appointments  Date Time Provider Department Center  10/11/2024 11:30 AM [facility] 3 [facility]  10/12/2024  3:20 PM Abran Norleen SAILOR, MD [facility]   Lab/Order associations: fasting   ICD-10-CM   1. Preventative health care  Z00.00     2. Immunization due  Z23 Flu vaccine HIGH DOSE PF(Fluzone Trivalent)    3. Age-related osteoporosis without current pathological fracture  M81.0 VITAMIN D  25 Hydroxy (Vit-D Deficiency, Fractures)    4. Thyroid  disease  E07.9 TSH    5. B12 deficiency  E53.8 Vitamin B12    6. Hyperglycemia  R73.9 Hemoglobin A1c    7. Mixed hyperlipidemia  E78.2 Comprehensive metabolic panel with GFR    CBC with Differential/Platelet    Lipid panel    8. Screening for diabetes mellitus  Z13.1 Hemoglobin A1c      Meds ordered this encounter  Medications   pravastatin  (PRAVACHOL ) 80 MG tablet    Sig: Take 0.5 tablets (40 mg total) by mouth daily.    Dispense:  90 tablet    Refill:  3    Return precautions advised.  Garnette Lukes, MD

## 2024-10-12 ENCOUNTER — Encounter: Payer: Self-pay | Admitting: Internal Medicine

## 2024-10-12 ENCOUNTER — Ambulatory Visit: Admitting: Internal Medicine

## 2024-10-12 VITALS — BP 122/72 | HR 71 | Ht 61.0 in | Wt 116.0 lb

## 2024-10-12 DIAGNOSIS — K219 Gastro-esophageal reflux disease without esophagitis: Secondary | ICD-10-CM | POA: Diagnosis not present

## 2024-10-12 DIAGNOSIS — Z1211 Encounter for screening for malignant neoplasm of colon: Secondary | ICD-10-CM

## 2024-10-12 DIAGNOSIS — K222 Esophageal obstruction: Secondary | ICD-10-CM

## 2024-10-12 MED ORDER — OMEPRAZOLE 40 MG PO CPDR: 40.0000 mg | DELAYED_RELEASE_CAPSULE | Freq: Every day | ORAL | 3 refills | Status: DC

## 2024-10-12 NOTE — Progress Notes (Signed)
 HISTORY OF PRESENT ILLNESS:  Crystal [name] is a 68 y.o. 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 Patient has also had a previous food impaction. She has phenotypic EOE type esophagus. Last esophageal dilation February 2021 up to 17 mm with Savary dilator.   She was last seen in the office May 27, 2023.  No dysphagia at that time.  She was continued on omeprazole  40 mg daily.     She presents today for routine follow-up regarding her complicated GERD.  She continues on omeprazole  40 mg daily.  She open contents of her capsules and places them on applesauce.  No reflux symptoms.  Though she denies dysphagia, she tells me that she chews her food very very carefully out of fear.  As well, she avoids pills.  She does request medication refill.  Otherwise she is doing well.  She had a wonderful summer with multiple golf trips including 1 to [place] and [place] .  GI review of systems otherwise negative  She underwent routine screening colonoscopy September 2022.  Examination was normal except for incidental sigmoid diverticulosis.  No neoplasia.  Follow-up in 10 years recommended.  REVIEW OF SYSTEMS:  All non-GI ROS negative. Past Medical History:  Diagnosis Date   Complication of anesthesia    kept a severe HA for 10 days after     Esophageal stenosis    Graves' disease 1985   treated with PTU   Hyperlipidemia    Insomnia    Osteoporosis    Wears glasses    White coat hypertension     Past Surgical History:  Procedure Laterality Date   BIOPSY  10/11/2019   Procedure: BIOPSY;  Surgeon: Avram Lupita BRAVO, MD;  Location: [facility];  Service: Endoscopy;;   COLONOSCOPY     ESOPHAGEAL DILATION  10/11/2019   Procedure: ESOPHAGEAL DILATION;  Surgeon: Avram Lupita BRAVO, MD;  Location: [facility];  Service: Endoscopy;;   ESOPHAGOGASTRODUODENOSCOPY N/A 10/11/2019    Procedure: ESOPHAGOGASTRODUODENOSCOPY (EGD);  Surgeon: Avram Lupita BRAVO, MD;  Location: [facility];  Service: Endoscopy;  Laterality: N/A;   ESOPHAGOGASTRODUODENOSCOPY (EGD) WITH PROPOFOL  N/A 11/15/2019   Procedure: ESOPHAGOGASTRODUODENOSCOPY (EGD) WITH PROPOFOL  With savary dilation, need fluroscopy;  Surgeon: Abran Norleen SAILOR, MD;  Location: [facility];  Service: Endoscopy;  Laterality: N/A;   ESOPHAGOGASTRODUODENOSCOPY (EGD) WITH PROPOFOL  N/A 12/27/2019   Procedure: ESOPHAGOGASTRODUODENOSCOPY (EGD) WITH PROPOFOL ;  Surgeon: Abran Norleen SAILOR, MD;  Location: [facility];  Service: Endoscopy;  Laterality: N/A;   ESOPHAGOGASTRODUODENOSCOPY (EGD) WITH PROPOFOL  N/A 02/07/2020   Procedure: ESOPHAGOGASTRODUODENOSCOPY (EGD) WITH PROPOFOL ;  Surgeon: Abran Norleen SAILOR, MD;  Location: [facility];  Service: Endoscopy;  Laterality: N/A;   FOREIGN BODY REMOVAL  10/11/2019   Procedure: FOREIGN BODY REMOVAL;  Surgeon: Avram Lupita BRAVO, MD;  Location: [facility];  Service: Endoscopy;;   SAVORY DILATION N/A 11/15/2019   Procedure: HARLEY DILATION;  Surgeon: Abran Norleen SAILOR, MD;  Location: [facility];  Service: Endoscopy;  Laterality: N/A;   SAVORY DILATION N/A 12/27/2019   Procedure: SAVORY DILATION;  Surgeon: Abran Norleen SAILOR, MD;  Location: [facility];  Service: Endoscopy;  Laterality: N/A;  Needs Fluroscopy   SAVORY DILATION N/A 02/07/2020   Procedure: SAVORY DILATION;  Surgeon: Abran Norleen SAILOR, MD;  Location: [facility];  Service: Endoscopy;  Laterality: N/A;  Need Fluro    Social History Crystal [name]  reports that she has never smoked. She has never used  smokeless tobacco. She reports current alcohol use of about 14.0 standard drinks of alcohol per week. She reports that she does not use drugs.  family history includes Atrial fibrillation in her sister; Dementia in her mother; Heart attack in her father; Heart disease in her father; Heart disease (age of onset: 51) in her mother; Hypertension in her sister; Stroke  in her father and sister.  Allergies  Allergen Reactions   Adhesive [Tape] Swelling   E-Mycin [Erythromycin Base] Swelling   Latex    Silicone Rash    Patient states that she has some silicone glasses that Breaks her face out. No other glasses does that to her.        PHYSICAL EXAMINATION: Vital signs: BP 122/72   Pulse 71   Ht 5' 1 (1.549 m)   Wt 116 lb (52.6 kg)   BMI 21.92 kg/m   Constitutional: generally well-appearing, no acute distress Psychiatric: alert and oriented x3, cooperative Eyes: extraocular movements intact, anicteric, conjunctiva pink Mouth: oral pharynx moist, no lesions Neck: supple no lymphadenopathy Cardiovascular: heart regular rate and rhythm, no murmur Lungs: clear to auscultation bilaterally Abdomen: soft, nontender, nondistended, no obvious ascites, no peritoneal signs, normal bowel sounds, no organomegaly Rectal: Omitted Extremities: no, cyanosis, or lower extremity edema bilaterally Skin: no lesions on visible extremities Neuro: No focal deficits.  Cranial nerves intact  ASSESSMENT:   1.  GERD complicated by high-grade peptic stricture requiring repeat esophageal dilation.  No active reflux symptoms.  She denies dysphagia but states she chews her food very carefully.  Avoids pills.  Places the contents of her omeprazole  in applesauce. 2.  Negative screening colonoscopy September 2022     PLAN:   1.  Refill omeprazole  40 mg daily.  Medication risks reviewed 2.  Reflux precautions 3.  Very specifically instructed to contact the office should she start noticing any significant dysphagia.  As such, she would require repeat esophageal dilation to avoid acute food impaction, as in the past.  She understands. 4.  Otherwise, routine office follow-up 1-2 years 5.  Screening colonoscopy around September 2032   30 minutes was spent preparing to see the patient, obtaining interval history, performing medically appropriate physical exam, counseling  educating the patient regarding the above listed issues, ordering medication, defining follow-up intervals, and documenting clinical information in the health record.

## 2024-10-12 NOTE — Patient Instructions (Signed)
 We have sent the following medications to your pharmacy for you to pick up at your convenience:  Omeprazole   Please follow up in 18 months.  _______________________________________________________  If your blood pressure at your visit was 140/90 or greater, please contact your primary care physician to follow up on this.  _______________________________________________________  If you are age 68 or older, your body mass index should be between 23-30. Your Body mass index is 21.92 kg/m. If this is out of the aforementioned range listed, please consider follow up with your Primary Care Provider.  If you are age 70 or younger, your body mass index should be between 19-25. Your Body mass index is 21.92 kg/m. If this is out of the aformentioned range listed, please consider follow up with your Primary Care Provider.   ________________________________________________________  The  GI providers would like to encourage you to use MYCHART to communicate with providers for non-urgent requests or questions.  Due to long hold times on the telephone, sending your provider a message by [facility] may be a faster and more efficient way to get a response.  Please allow 48 business hours for a response.  Please remember that this is for non-urgent requests.  _______________________________________________________  [facility] is using a team-based approach to care.  Your team is made up of your doctor and two to three APPS. Our APPS (Nurse Practitioners and Physician Assistants) work with your physician to ensure care continuity for you. They are fully qualified to address your health concerns and develop a treatment plan. They communicate directly with your gastroenterologist to care for you. Seeing the Advanced Practice Practitioners on your physician's team can help you by facilitating care more promptly, often allowing for earlier appointments, access to diagnostic testing, procedures, and  other specialty referrals.

## 2024-10-22 ENCOUNTER — Ambulatory Visit: Admitting: Internal Medicine

## 2024-12-06 ENCOUNTER — Other Ambulatory Visit: Payer: Self-pay | Admitting: Internal Medicine

## 2024-12-08 ENCOUNTER — Telehealth: Payer: Self-pay | Admitting: Pharmacy Technician

## 2024-12-08 ENCOUNTER — Encounter: Payer: Self-pay | Admitting: Pharmacist

## 2024-12-08 ENCOUNTER — Other Ambulatory Visit (HOSPITAL_COMMUNITY): Payer: Self-pay

## 2024-12-08 ENCOUNTER — Telehealth: Payer: Self-pay | Admitting: Pharmacist

## 2024-12-08 DIAGNOSIS — E782 Mixed hyperlipidemia: Secondary | ICD-10-CM

## 2024-12-08 DIAGNOSIS — R931 Abnormal findings on diagnostic imaging of heart and coronary circulation: Secondary | ICD-10-CM

## 2024-12-08 NOTE — Telephone Encounter (Signed)
 Pharmacy Patient Advocate Encounter  Received notification from [facility] that Prior Authorization for repatha has been APPROVED from 12/08/24 to 06/08/25. Ran test claim, Copay is $25.00. This test claim was processed through [facility]- copay amounts may vary at other pharmacies due to pharmacy/plan contracts, or as the patient moves through the different stages of their insurance plan.   PA #/Case ID/Reference #: [id number]

## 2024-12-08 NOTE — Telephone Encounter (Signed)
° °  Pharmacy Patient Advocate Encounter   Received notification from Pt Calls Messages that prior authorization for repatha is required/requested.   Insurance verification completed.   The patient is insured through [facility].   Per test claim: PA required; PA submitted to above mentioned insurance via Latent Key/confirmation #/EOC [id number] Status is pending

## 2024-12-08 NOTE — Telephone Encounter (Signed)
 Please complete PA for Repatha

## 2024-12-17 ENCOUNTER — Encounter: Payer: Self-pay | Admitting: Family Medicine

## 2024-12-17 MED ORDER — REPATHA SURECLICK 140 MG/ML ~~LOC~~ SOAJ: 1.0000 mL | SUBCUTANEOUS | 1 refills | Status: AC

## 2024-12-17 NOTE — Addendum Note (Signed)
 Addended by: DARRELL BRUCKNER on: 12/17/2024 12:02 PM   Modules accepted: Orders

## 2024-12-17 NOTE — Telephone Encounter (Signed)
 Please review and advise. Tks

## 2024-12-20 MED ORDER — PRAVASTATIN SODIUM 80 MG PO TABS: 80.0000 mg | ORAL_TABLET | Freq: Every day | ORAL | 3 refills | Status: AC

## 2025-01-17 ENCOUNTER — Encounter: Payer: Self-pay | Admitting: Family Medicine

## 2025-10-12 ENCOUNTER — Encounter: Admitting: Family Medicine
